# Patient Record
Sex: Male | Born: 1950 | Race: White | Hispanic: No | Marital: Single
Health system: Midwestern US, Academic
[De-identification: ages and names within clinical notes are randomized; demographics above are authoritative.]

---

## 2018-01-28 IMAGING — CT SINUS_(Adult)
3 of 4 series · 11 of 47 positions shown, 13 images · non-contrast
Comparison: none

[Series 2: sinus ax 3.00 hr60 s3 · axial · 0.27mm/px · z∈[-498,-419]mm · 5 of 39 slices shown, 7 images]
[im 6/39  brain]
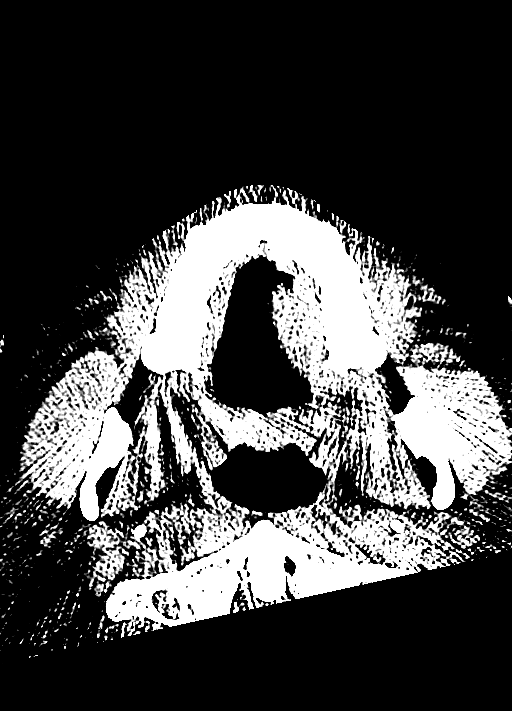
[im 6/39  bone]
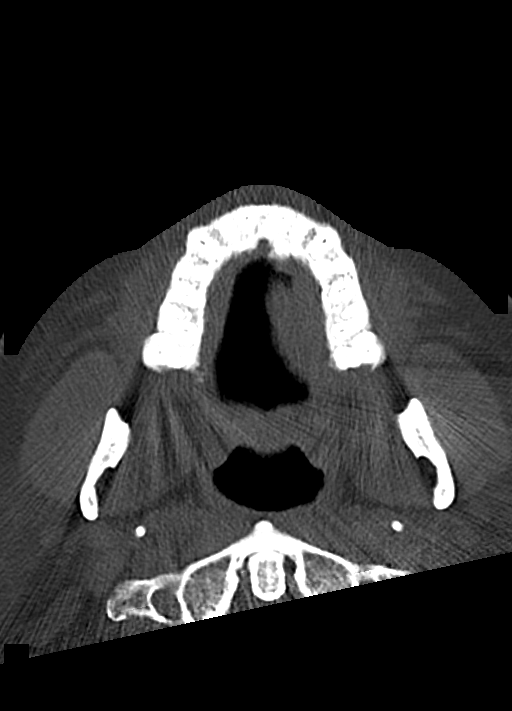
[im 11/39  bone]
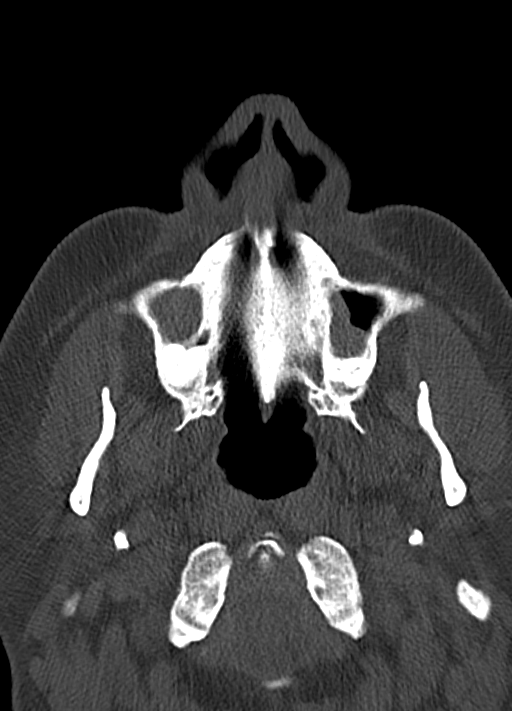
[im 20/39  bone]
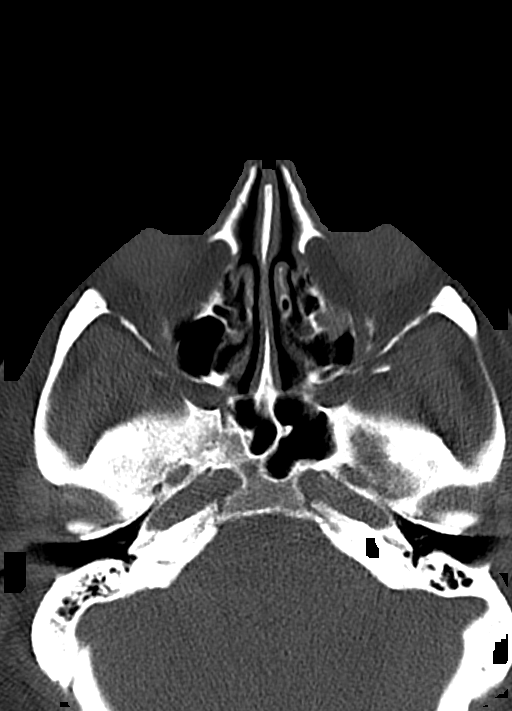
[im 28/39  bone]
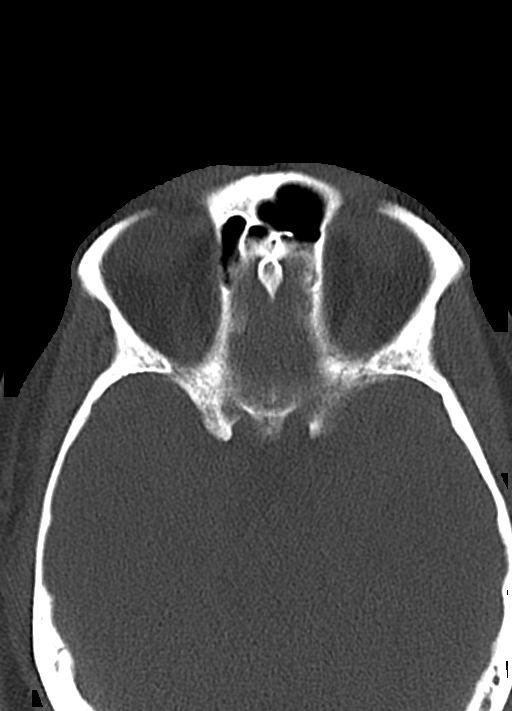
[im 33/39  brain]
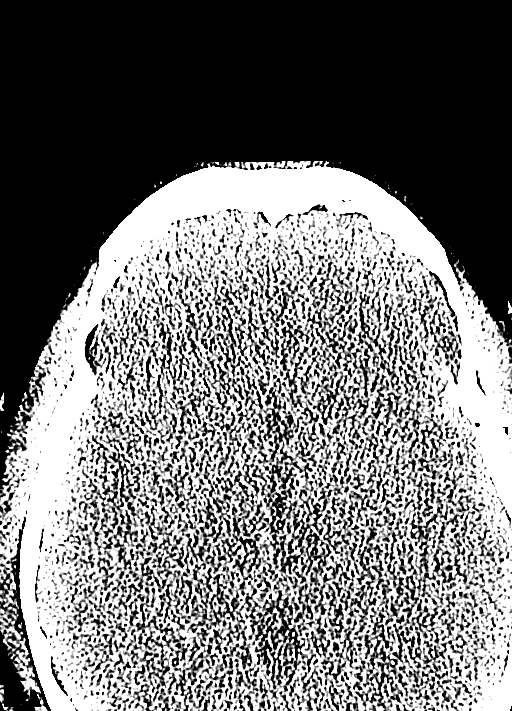
[im 33/39  bone]
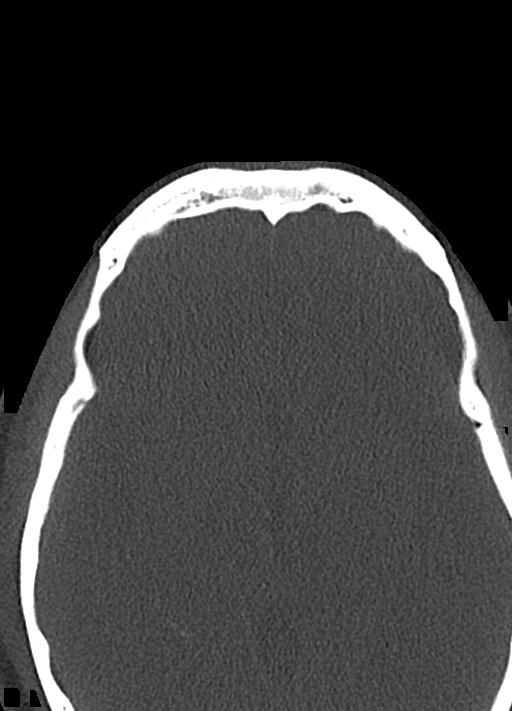

[Series 4: sinus cor 3.00 hr60 s3 · coronal · 0.23mm/px · 3 of 63 slices shown]
[im 21/63  bone]
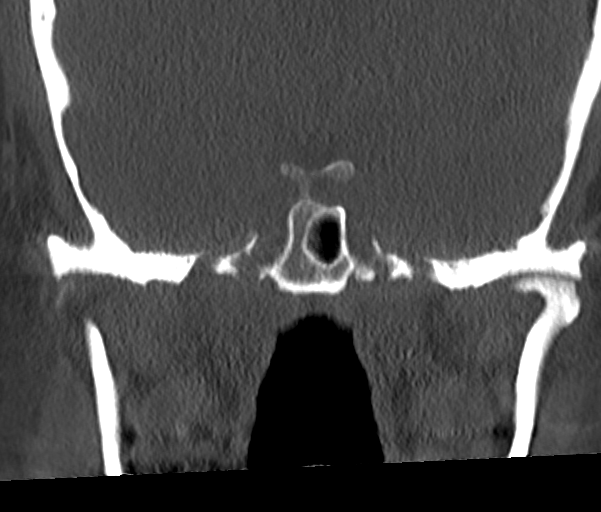
[im 28/63  bone]
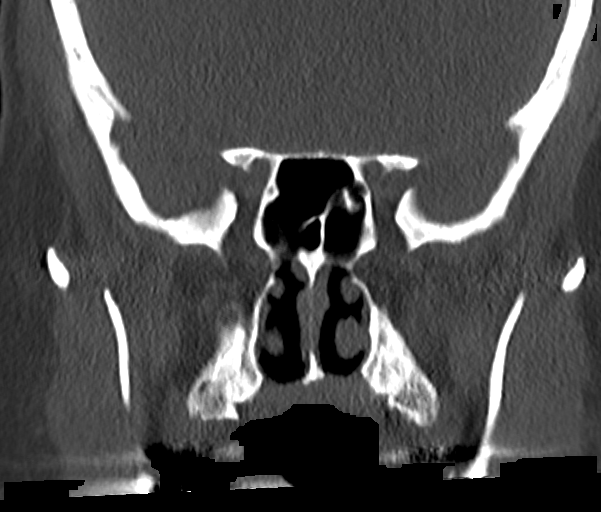
[im 35/63  bone]
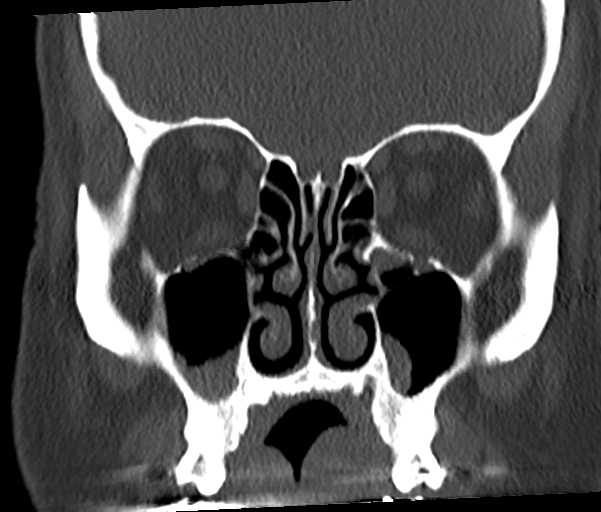

[Series 6: sinus sag 3.00 hr60 s3 · sagittal · 0.23mm/px · 3 of 45 slices shown]
[im 15/45  bone]
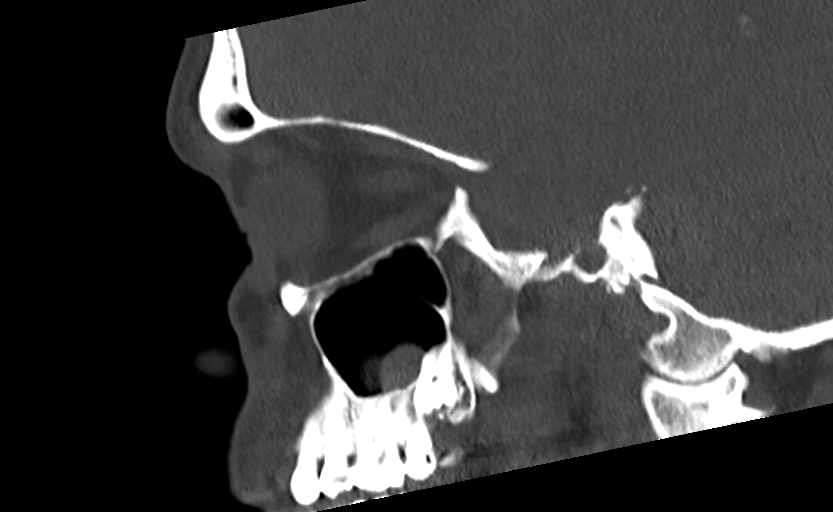
[im 23/45  bone]
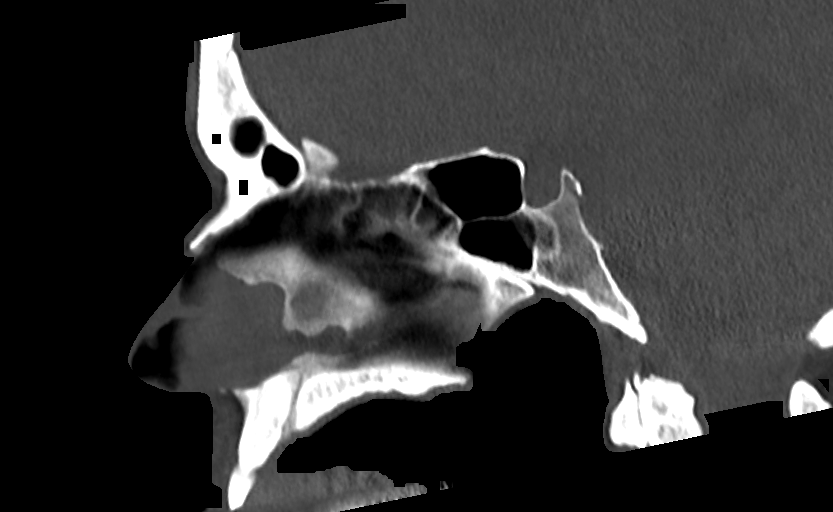
[im 30/45  bone]
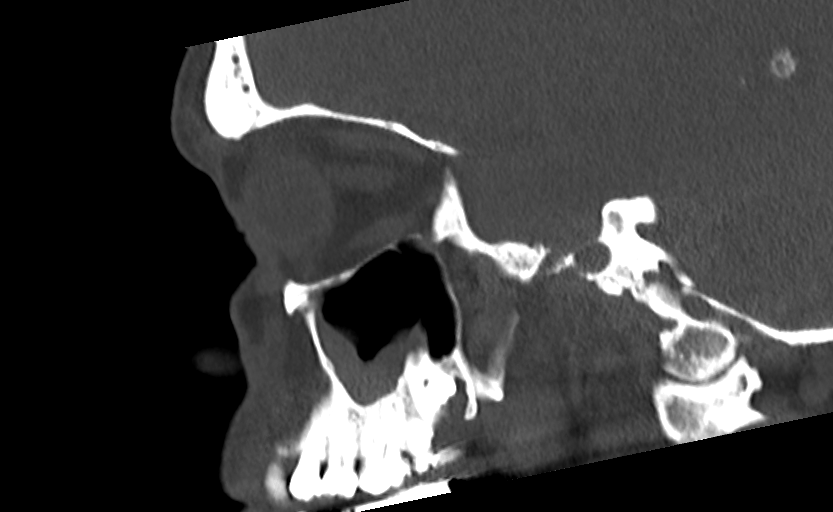

[11 of 47 positions shown; findings below may reference images not displayed]

------------- REPORT [id number] -------------
EXAM

CT paranasal sinuses without contrast.

INDICATION

chronic sinusitis
chronic sinusitis

FINDINGS

CT of the paranasal sinuses was performed without contrast.

All CT scans at this facility use dose modulation, iterative reconstruction, and/or weight based
dosing when appropriate to reduce radiation dose to as low as reasonably achievable.

There of been no CT scans in no nuclear medicine myocardial perfusion scans in the past 12 months.

There is moderately pronounced mucosal thickening involving the maxillary sinuses bilaterally, more
notably on the right.  Small mucous retention cysts are present bilaterally.

There is very mild mucosal thickening in the ethmoid sinuses.  There is hypoplasia of the right
frontal sinus.  The left frontal sinus is clear.  The sphenoid sinus is clear.

There is patency of the ostiomeatal complexes bilaterally.

IMPRESSION

There is evidence of moderate chronic bilateral maxillary sinusitis, slightly more notably on the
right.  There is mild chronic bilateral ethmoid sinusitis.  There is no bone destruction in there
are no air-fluid levels.  There is patency of the ostiomeatal complexes.

Tech Notes:

chronic sinusitis

------------- REPORT [id number] -------------
**ADDENDUM**
ADDENDUM

Number of previous computed tomography exams in the last 12 months is 0.

Number of previous nuclear medicine myocardial perfusion studies in the last 12 months is 0.

TD/TT: /

EXAM

CT paranasal sinuses without contrast.

INDICATION

chronic sinusitis
chronic sinusitis

FINDINGS

CT of the paranasal sinuses was performed without contrast.

All CT scans at this facility use dose modulation, iterative reconstruction, and/or weight based
dosing when appropriate to reduce radiation dose to as low as reasonably achievable.

There of been no CT scans in no nuclear medicine myocardial perfusion scans in the past 12 months.

There is moderately pronounced mucosal thickening involving the maxillary sinuses bilaterally, more
notably on the right.  Small mucous retention cysts are present bilaterally.

There is very mild mucosal thickening in the ethmoid sinuses.  There is hypoplasia of the right
frontal sinus.  The left frontal sinus is clear.  The sphenoid sinus is clear.

There is patency of the ostiomeatal complexes bilaterally.

IMPRESSION

There is evidence of moderate chronic bilateral maxillary sinusitis, slightly more notably on the
right.  There is mild chronic bilateral ethmoid sinusitis.  There is no bone destruction in there
are no air-fluid levels.  There is patency of the ostiomeatal complexes.

Tech Notes:

chronic sinusitis

## 2019-01-05 IMAGING — CR CHEST
3 series · 3 of 3 positions shown · non-contrast
Comparison: none

[shoulder external]
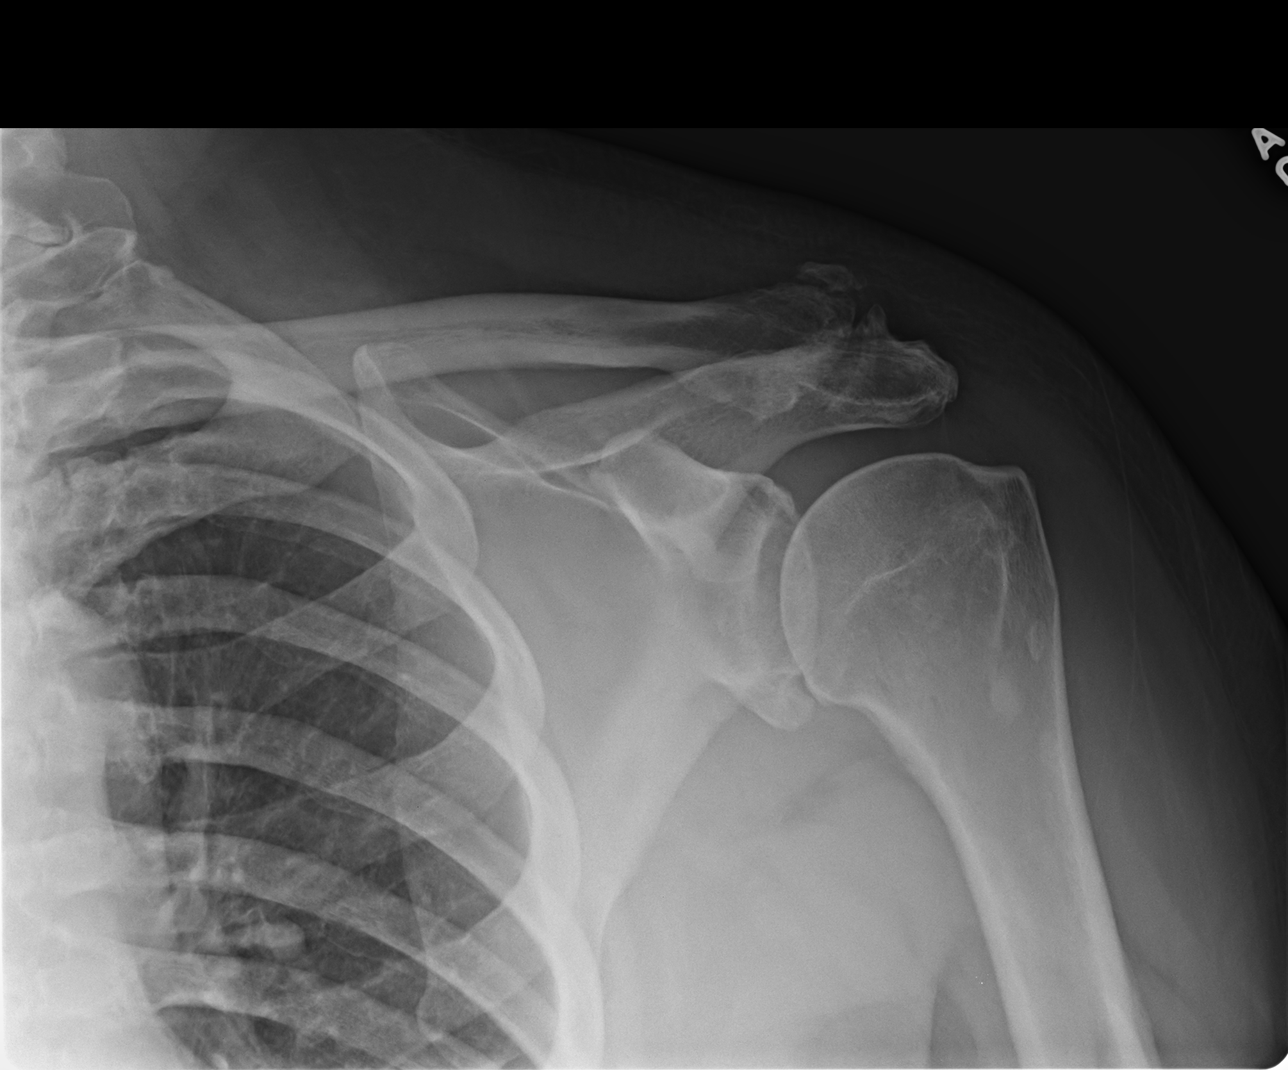

[shoulder internal]
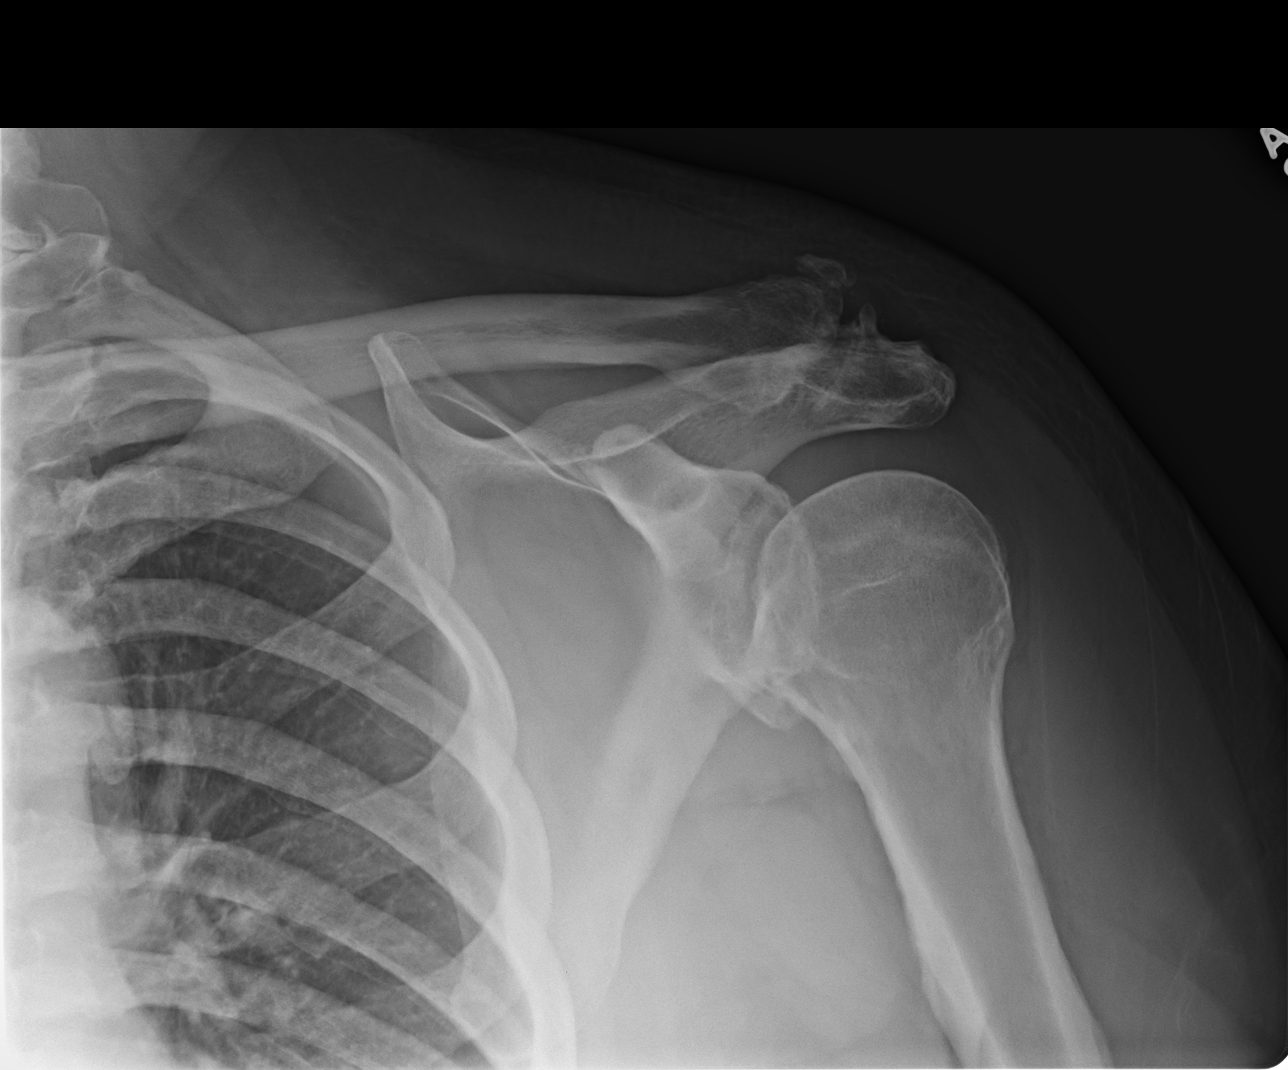

[shoulder y-view]
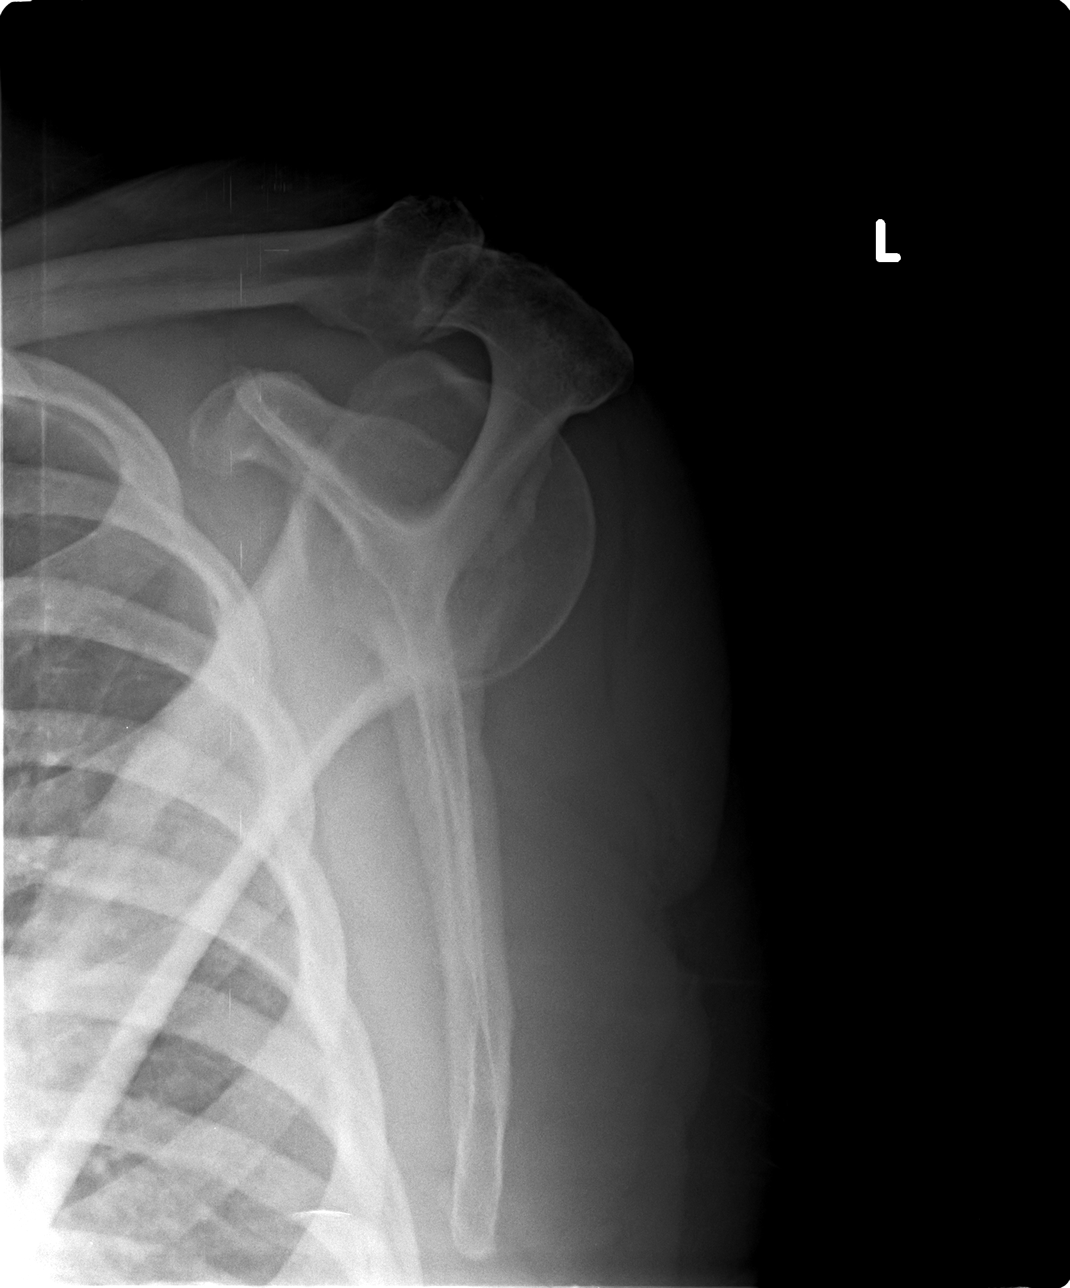

[3 of 3 positions shown; findings below may reference images not displayed]

EXAM

Left shoulder.

INDICATION

pain
Pt states pail in bilateral shoulders in joint area. PT states no known injury. JC/TB

FINDINGS

Three views of the left shoulder were obtained.

There are large osteophytes of the distal clavicle and acromion process.  There is ossification of
the within the soft tissues along the superior margin of the distal clavicle.

There are moderately sized osteophytes of the glenoid process, especially inferiorly.  There are
subchondral cysts of the glenoid process.  There are small osteophytes of the humeral head.

There is no fracture or dislocation.

IMPRESSION

There is pronounced osteoarthrosis of the left AC joint and moderate osteoarthrosis of the
glenohumeral joint.  No fracture or dislocation is identified.

Tech Notes:

Pt states pail in bilateral shoulders in joint area. PT states no known injury. JC/TB

## 2019-01-05 IMAGING — CR CHEST
3 series · 3 of 3 positions shown · non-contrast
Comparison: none

[shoulder external]
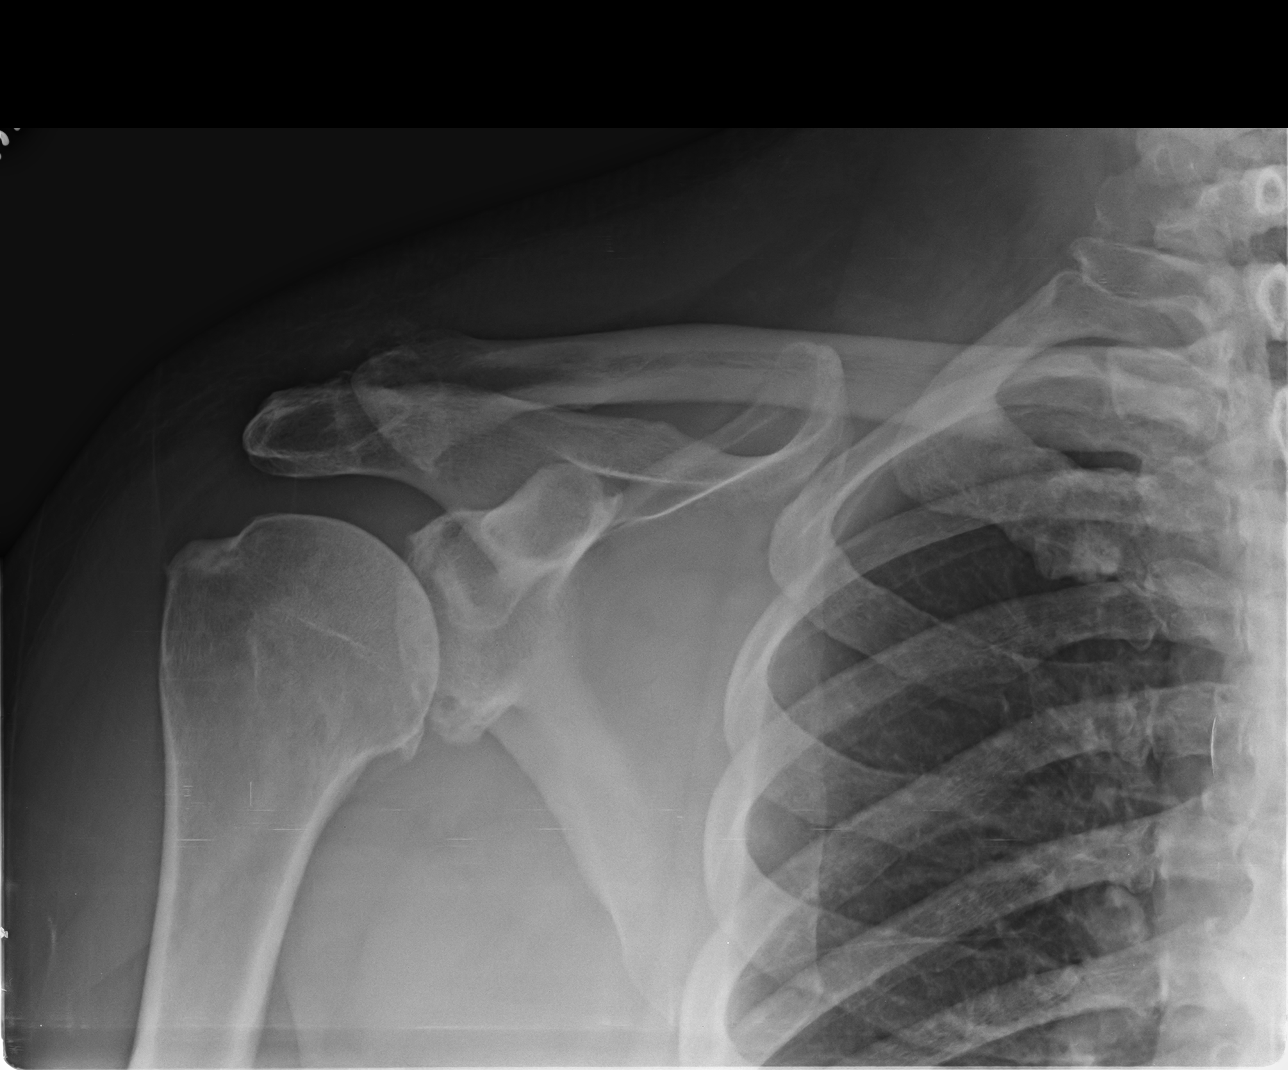

[shoulder internal]
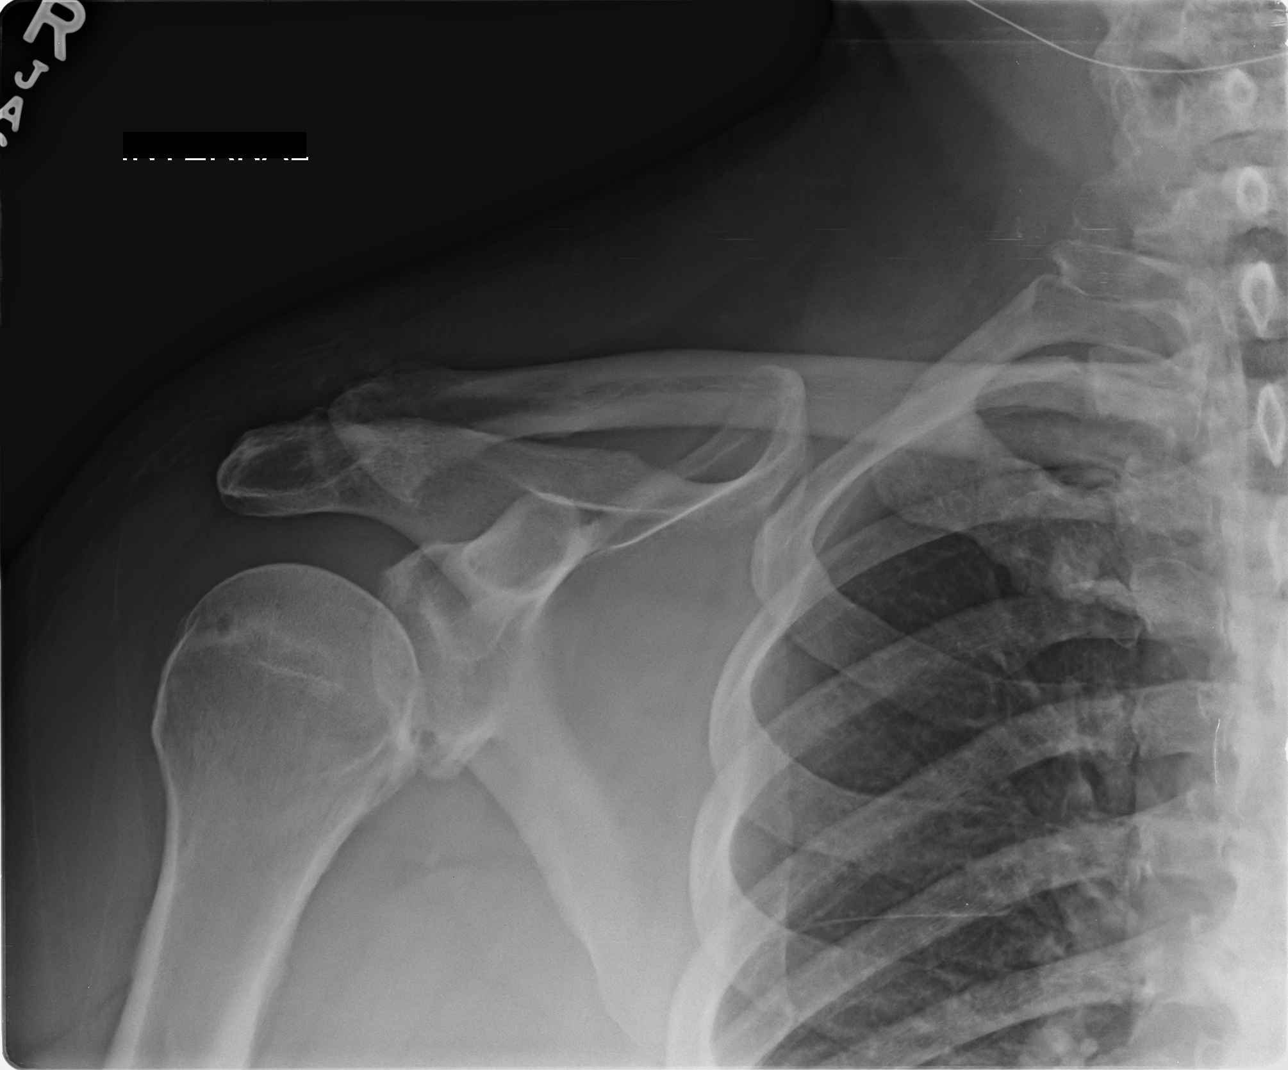

[shoulder y-view]
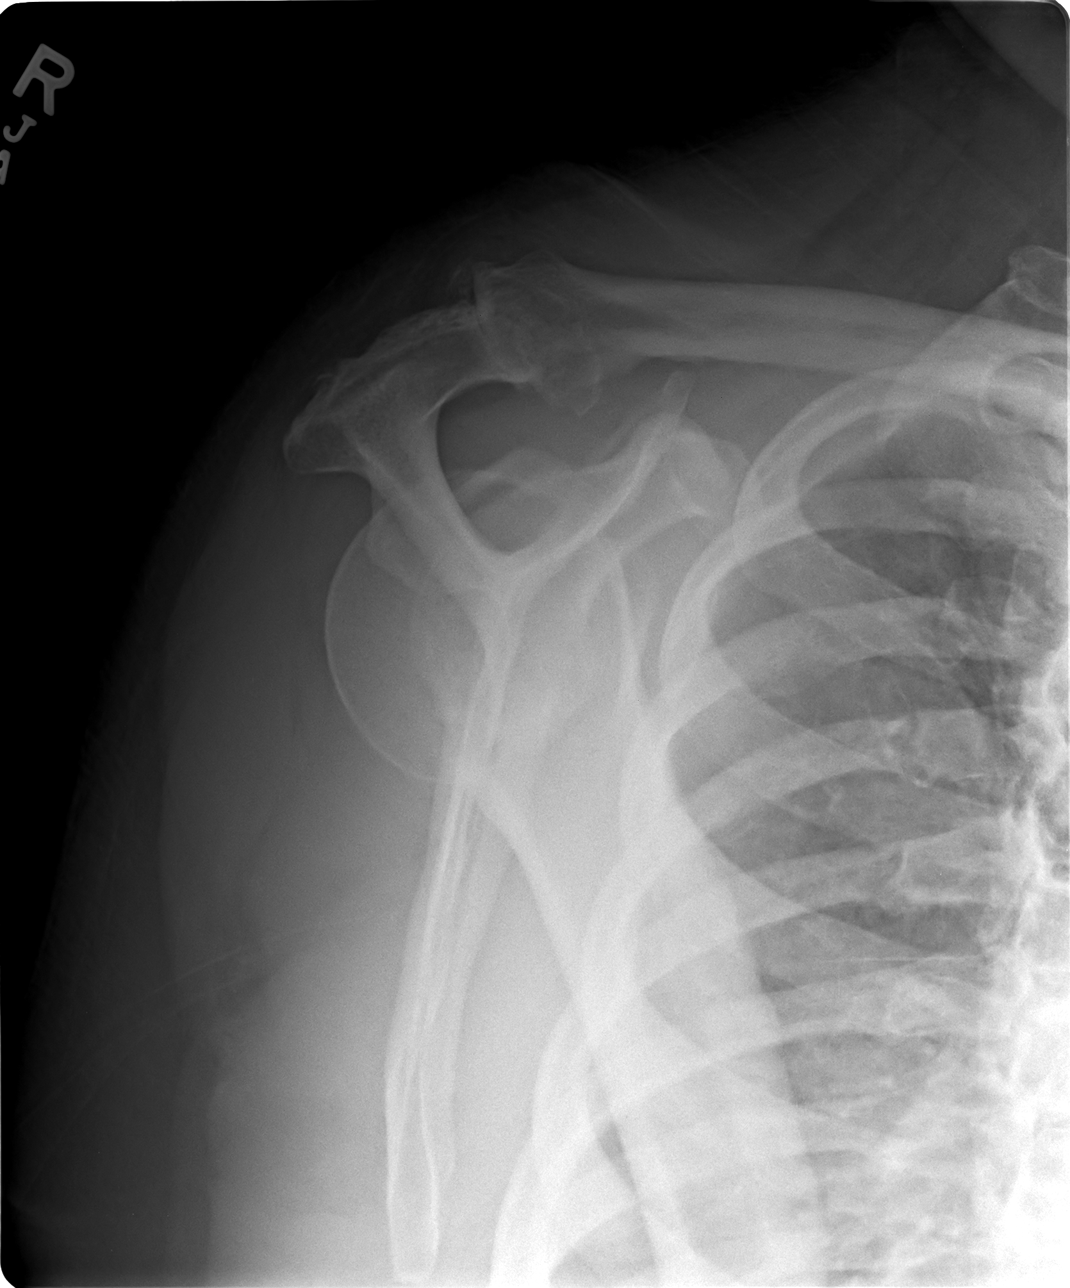

[3 of 3 positions shown; findings below may reference images not displayed]

EXAM

Right shoulder.

INDICATION

pain
Pt states pain in bilateral shoulders in joint area. PT states no known injury. JC/TB

FINDINGS

Three views of the right shoulder were obtained.

There are moderately sized osteophytes of the distal clavicle and acromion process.  There are
moderately sized osteophytes of the glenoid process and humeral head.  There are subchondral cysts
of the glenoid process.

There is no fracture or dislocation.

IMPRESSION

There is moderately pronounced osteoarthrosis of the right glenohumeral and AC joints.  No acute
appearing abnormality is identified.

Tech Notes:

Pt states pain in bilateral shoulders in joint area. PT states no known injury. JC/TB

## 2019-01-06 IMAGING — CR CHEST
3 series · 3 of 3 positions shown · non-contrast
Comparison: none

[shoulder grashey]
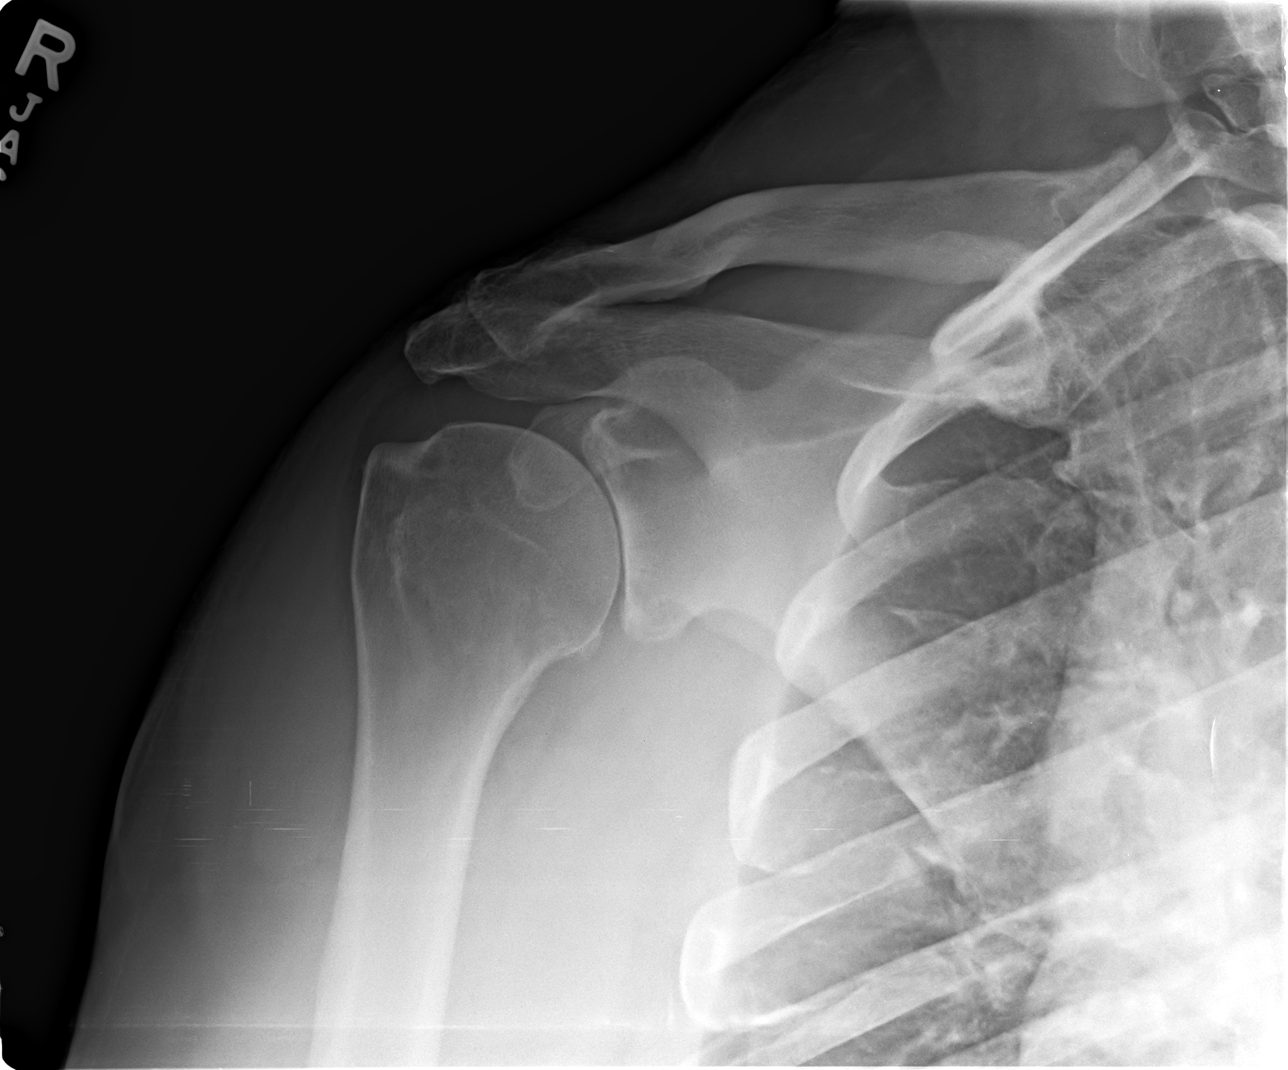

[shoulder y-view]
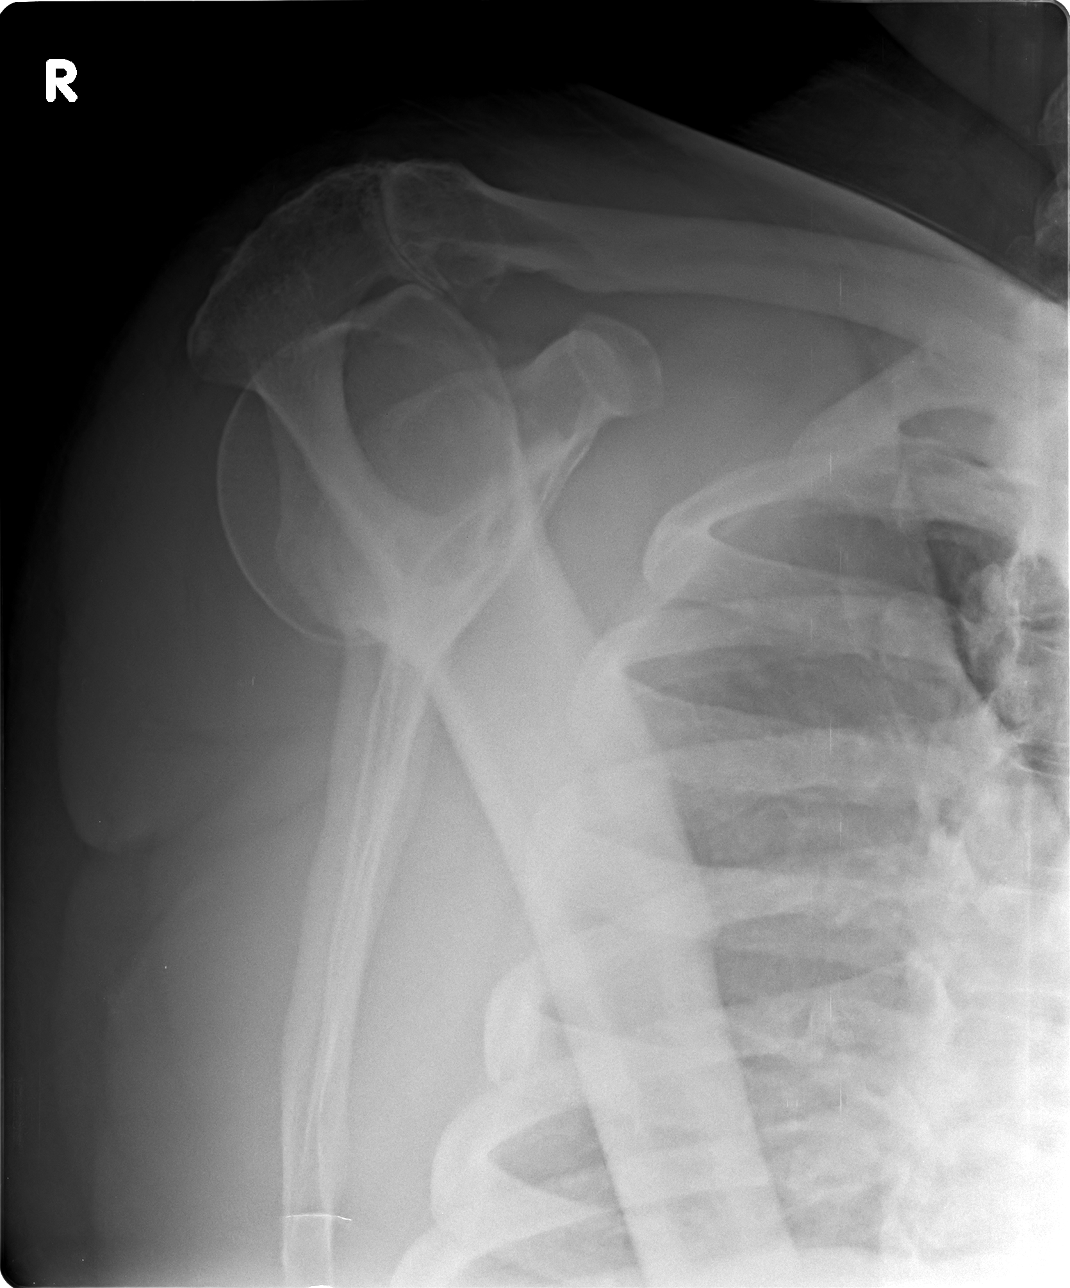

[shoulder axillary]
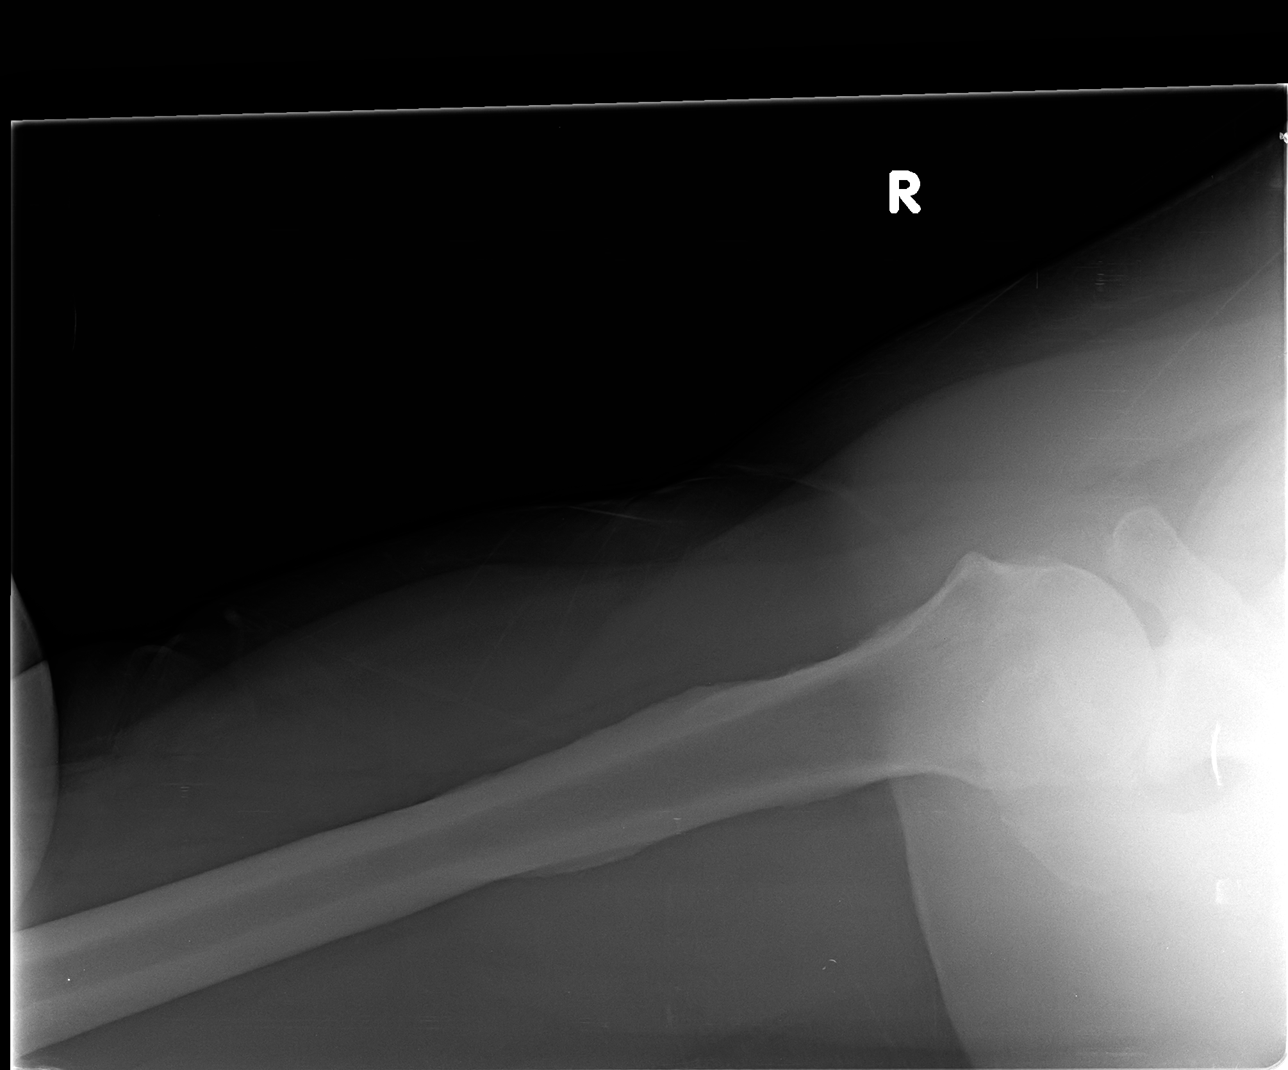

[3 of 3 positions shown; findings below may reference images not displayed]

EXAM

RADIOLOGICAL EXAMINATION SHOULDER; COMPLETE, MINIMUM 2 VIEWS CPT 69494

INDICATION

LEFT shoulder pain. CHRONIC BILAT SHOULDER PAIN W/DECREASED ROM. JC/HB

TECHNIQUE

Three views of the left shoulder were acquired.

COMPARISONS

Reference is made to standard three-view left shoulder performed on 01/05/2019.

FINDINGS

Grashey day, axillary and Y-views views of the left shoulder show no acute fracture or dislocation.
AC joint appears aligned with moderately severe osteophytic spurring and subacromial spurring.

Moderate degenerative change of the glenohumeral joint is present as well. There are no blastic or
lytic lesions. Adjacent ribs appear normal. Soft tissues appear normal.

IMPRESSION

Osteoarthrosis of a moderately severe degree is identified in the left shoulder joint.

EXAM

RADIOLOGICAL EXAMINATION SHOULDER; COMPLETE, MINIMUM 2 VIEWS CPT 69494

INDICATION

RIGHT shoulder pain. CHRONIC BILAT SHOULDER PAIN W/DECREASED ROM. JC/HB

TECHNIQUE

Three views of the right shoulder were acquired.

COMPARISONS

Reference is made to standard three-view right shoulder dated 01/05/2019.

FINDINGS

Grashey, scapular Y and axillary views of the right shoulder show no acute fracture or dislocation.
AC joint appears aligned with moderately severe degenerative change and subacromial spurring. There
are no blastic or lytic lesions. Adjacent ribs appear normal. Soft tissues appear normal.

IMPRESSION

Moderately severe osteoarthrosis of the acromioclavicular and glenohumeral joints.

Tech Notes:

CHRONIC BILAT SHOULDER PAIN W/DECREASED ROM. JC/HB

## 2019-01-06 IMAGING — CR CHEST
3 series · 3 of 3 positions shown · non-contrast
Comparison: none

[shoulder grashey]
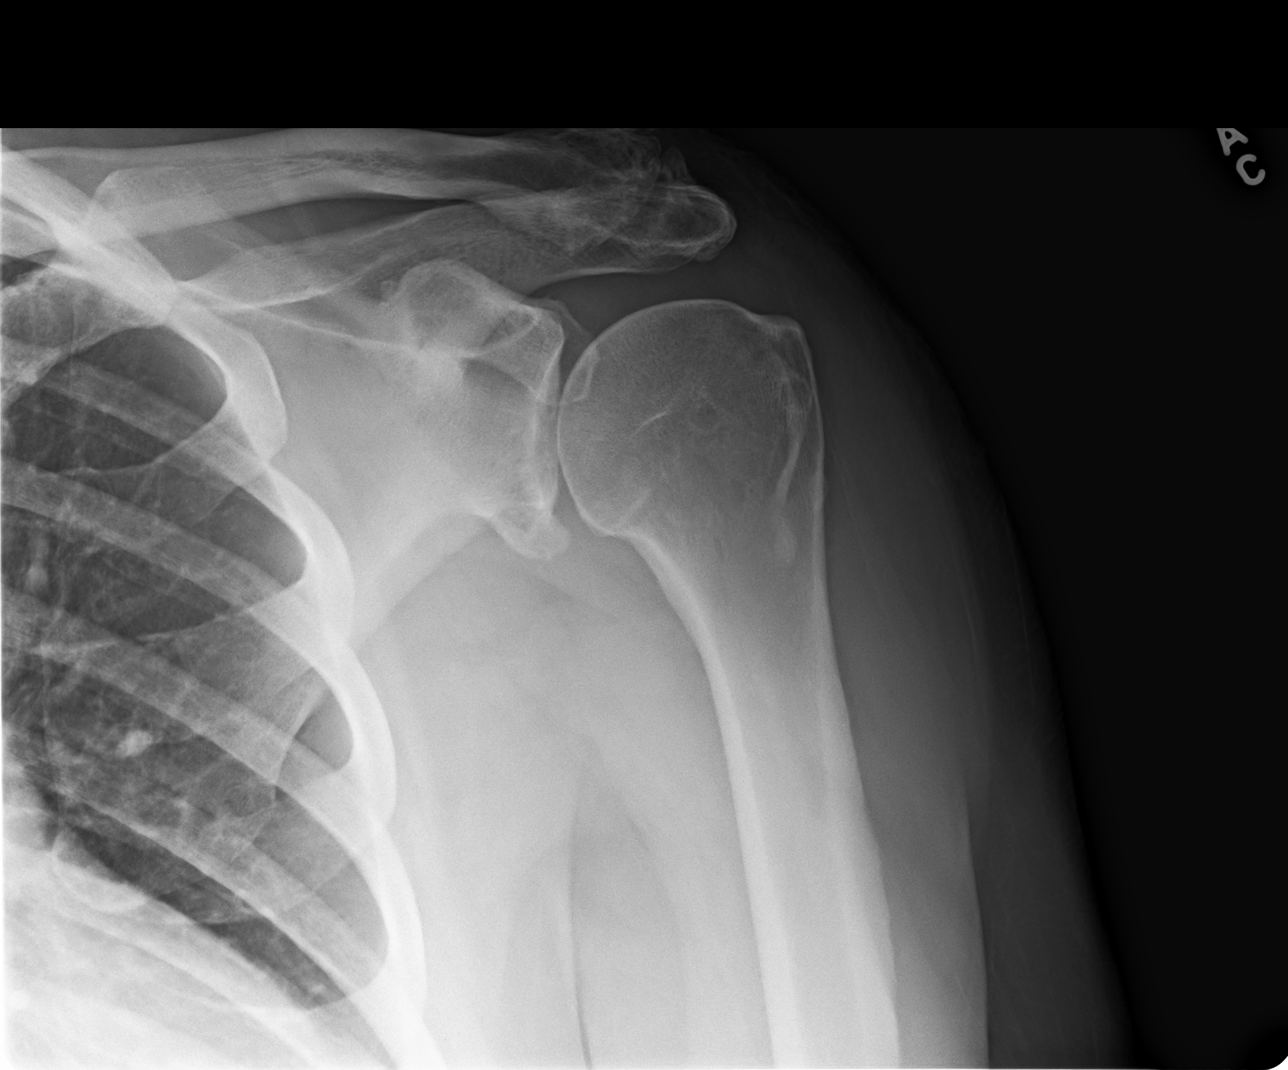

[shoulder y-view]
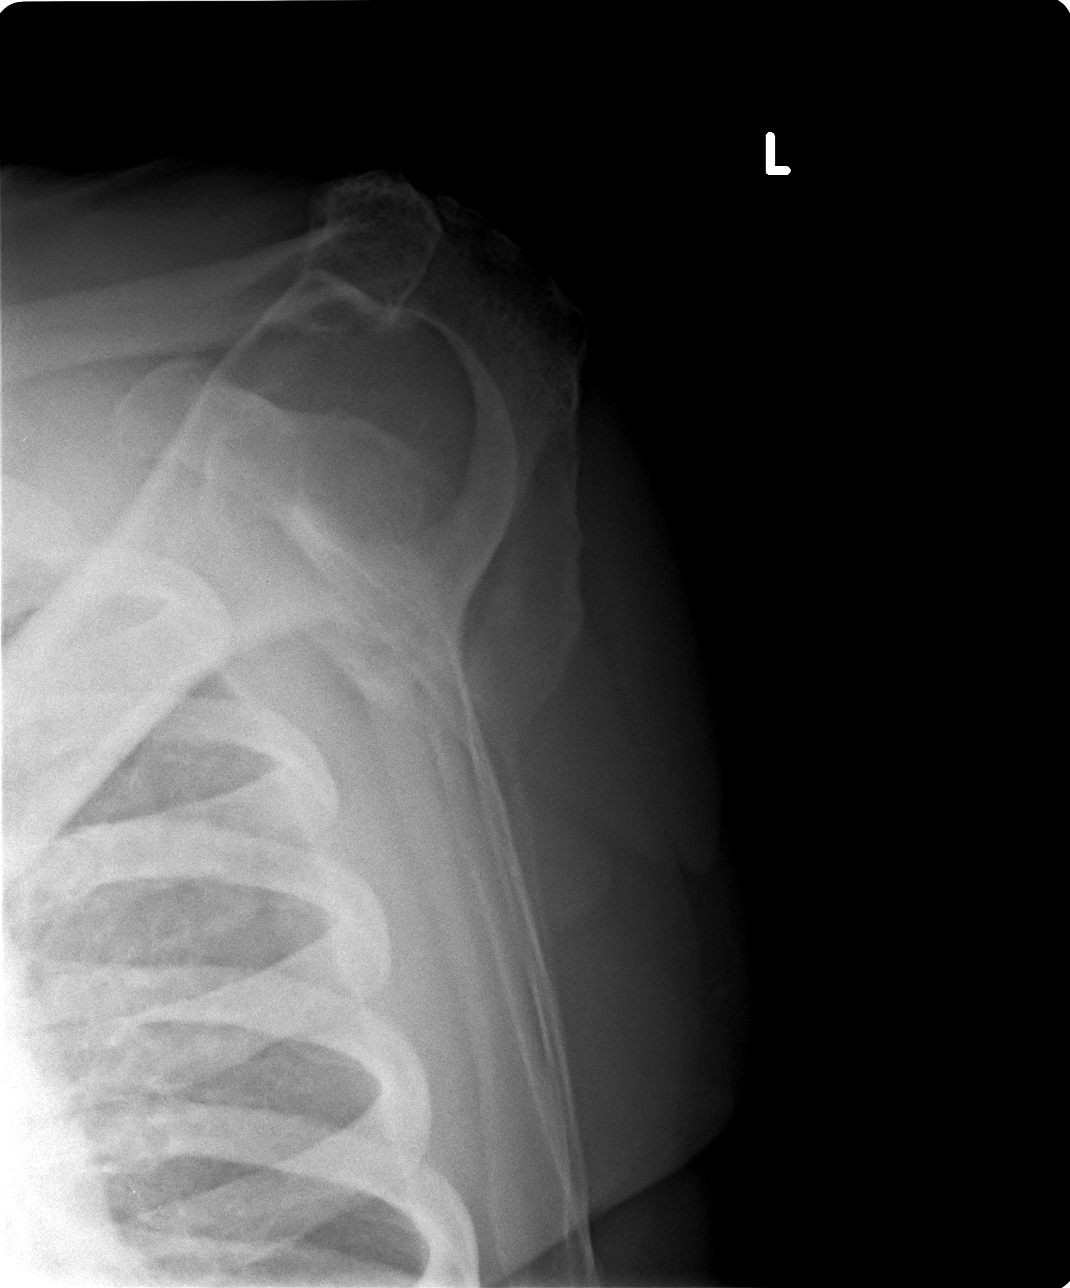

[shoulder axillary]
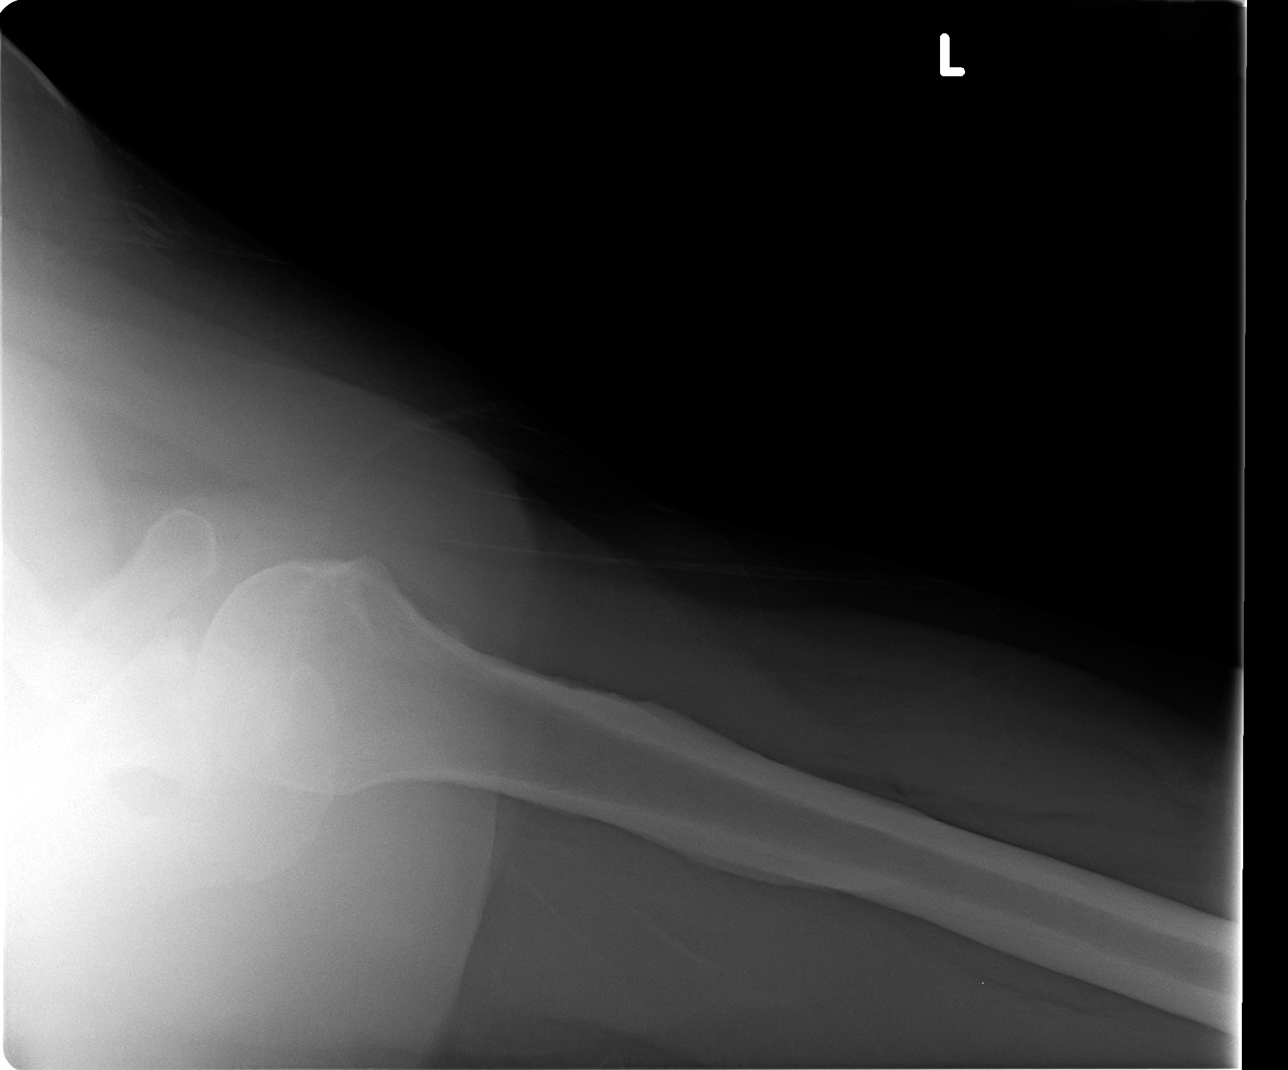

[3 of 3 positions shown; findings below may reference images not displayed]

EXAM

RADIOLOGICAL EXAMINATION SHOULDER; COMPLETE, MINIMUM 2 VIEWS CPT 81414

INDICATION

LEFT shoulder pain. CHRONIC BILAT SHOULDER PAIN W/DECREASED ROM. JC/HB

TECHNIQUE

Three views of the left shoulder were acquired.

COMPARISONS

Reference is made to standard three-view left shoulder performed on 01/05/2019.

FINDINGS

Grashey day, axillary and Y-views views of the left shoulder show no acute fracture or dislocation.
AC joint appears aligned with moderately severe osteophytic spurring and subacromial spurring.

Moderate degenerative change of the glenohumeral joint is present as well. There are no blastic or
lytic lesions. Adjacent ribs appear normal. Soft tissues appear normal.

IMPRESSION

Osteoarthrosis of a moderately severe degree is identified in the left shoulder joint.

EXAM

RADIOLOGICAL EXAMINATION SHOULDER; COMPLETE, MINIMUM 2 VIEWS CPT 81414

INDICATION

RIGHT shoulder pain. CHRONIC BILAT SHOULDER PAIN W/DECREASED ROM. JC/HB

TECHNIQUE

Three views of the  right shoulder were acquired.

COMPARISONS

Reference is made to standard three-view right shoulder dated 01/05/2019.

FINDINGS

Grashey, scapular Y and axillary views of the  right shoulder show no acute fracture or
dislocation. AC joint appears aligned with moderately severe degenerative change and subacromial
spurring. There are no blastic or lytic lesions. Adjacent ribs appear normal. Soft tissues appear
normal.

IMPRESSION

Moderately severe osteoarthrosis of the acromioclavicular and glenohumeral joints.

Tech Notes:

CHRONIC BILAT SHOULDER PAIN W/DECREASED ROM. JC/HB

## 2020-04-25 IMAGING — CR SHOULDCMRT
3 series · 3 of 3 positions shown · non-contrast
Comparison: none

[shoulder external]
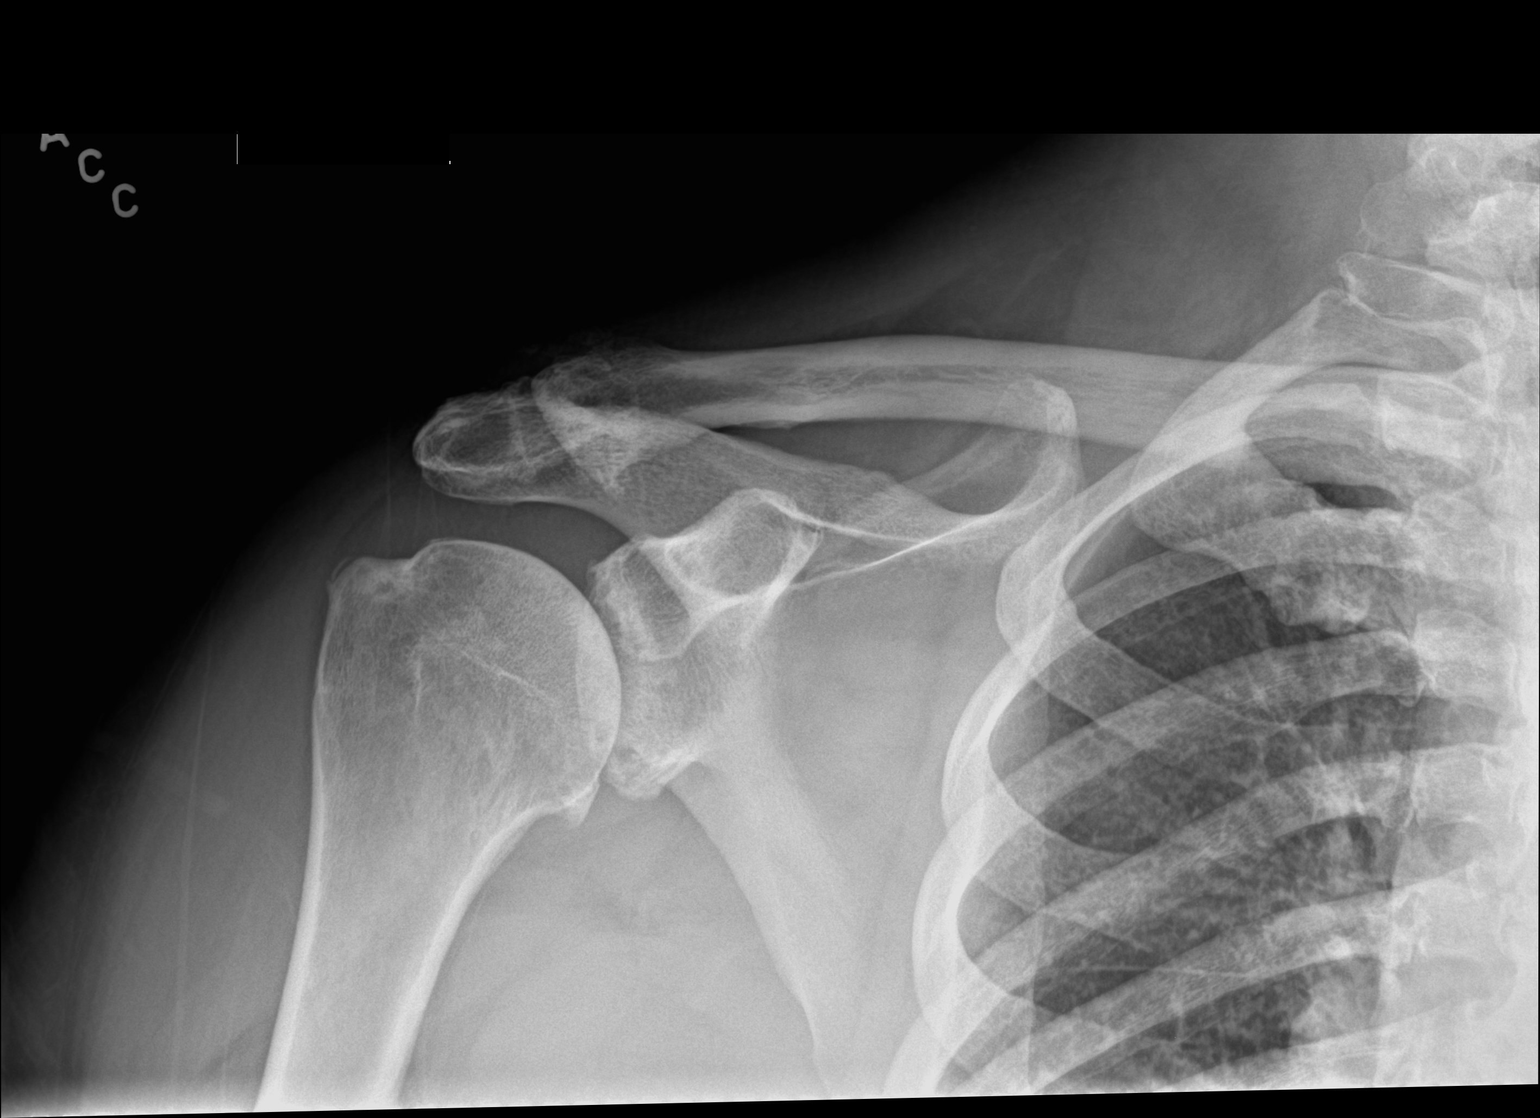

[shoulder internal]
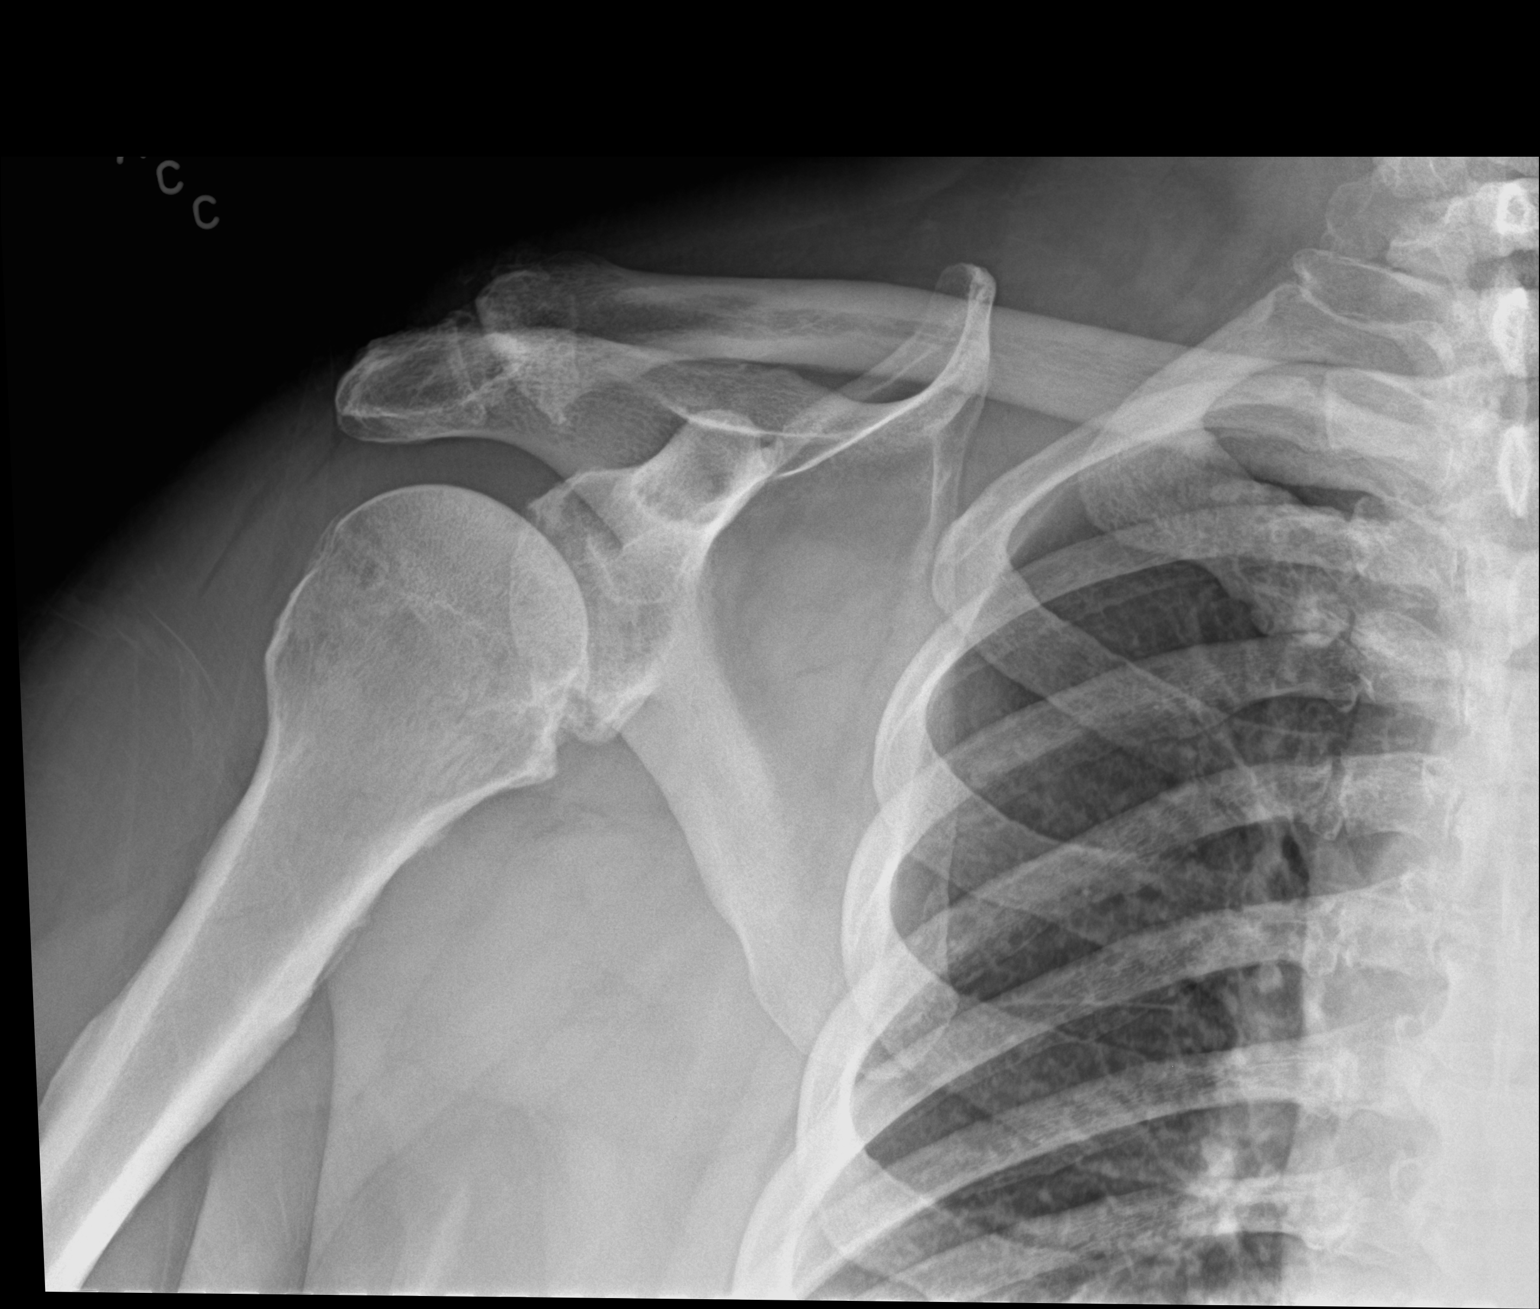

[shoulder axillary]
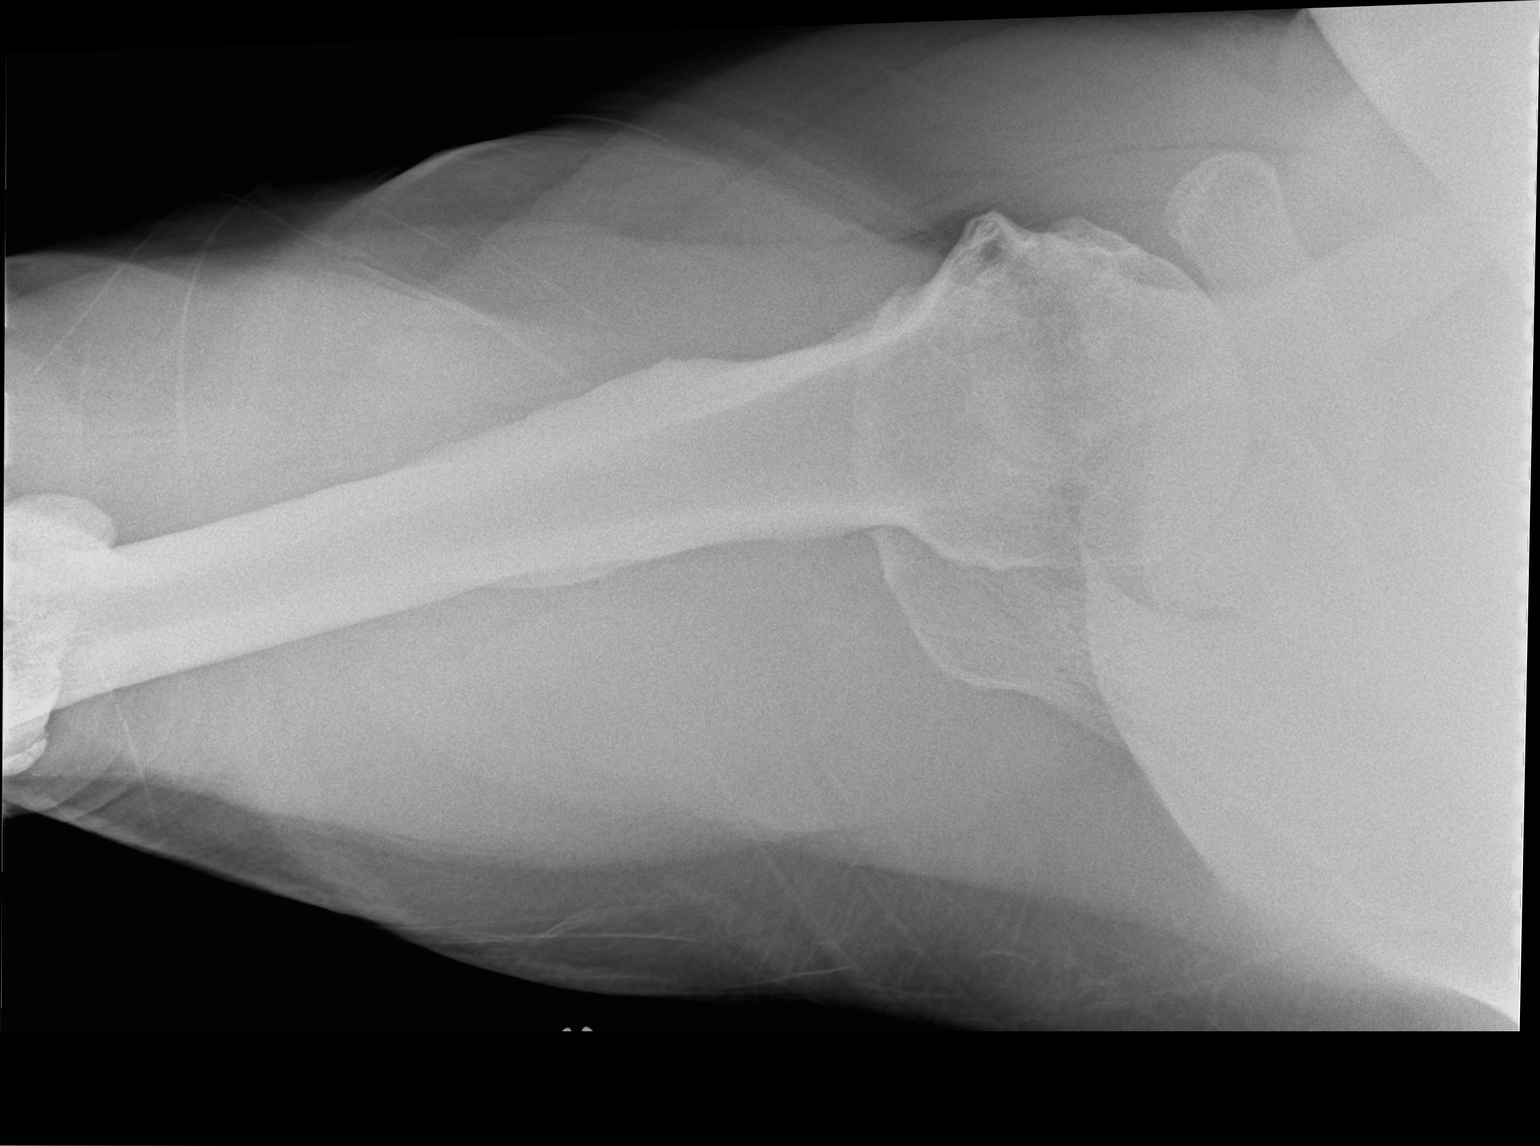

[3 of 3 positions shown; findings below may reference images not displayed]

DIAGNOSTIC STUDIES

EXAM

XR shoulder right, complete

INDICATION

pain
Pt c/o right shoulder pain x years. CF/AC

COMPARISONS

01/06/2019

FINDINGS

No acute fracture or dislocation. Similar appearance of narrowing of the glenohumeral joint space
with marginal osteophyte formation. Similar narrowing of the acromioclavicular joint with osteophyte
formation. Overlying soft tissues are within normal limits.

IMPRESSION

Moderate glenohumeral joint and severe acromioclavicular joint osteoarthritis.

Tech Notes:

Pt c/o right shoulder pain x years. CF/AC

## 2020-04-25 IMAGING — CR SHOULDCMLT
2 series · 2 of 2 positions shown · non-contrast
Comparison: none

[shoulder external]
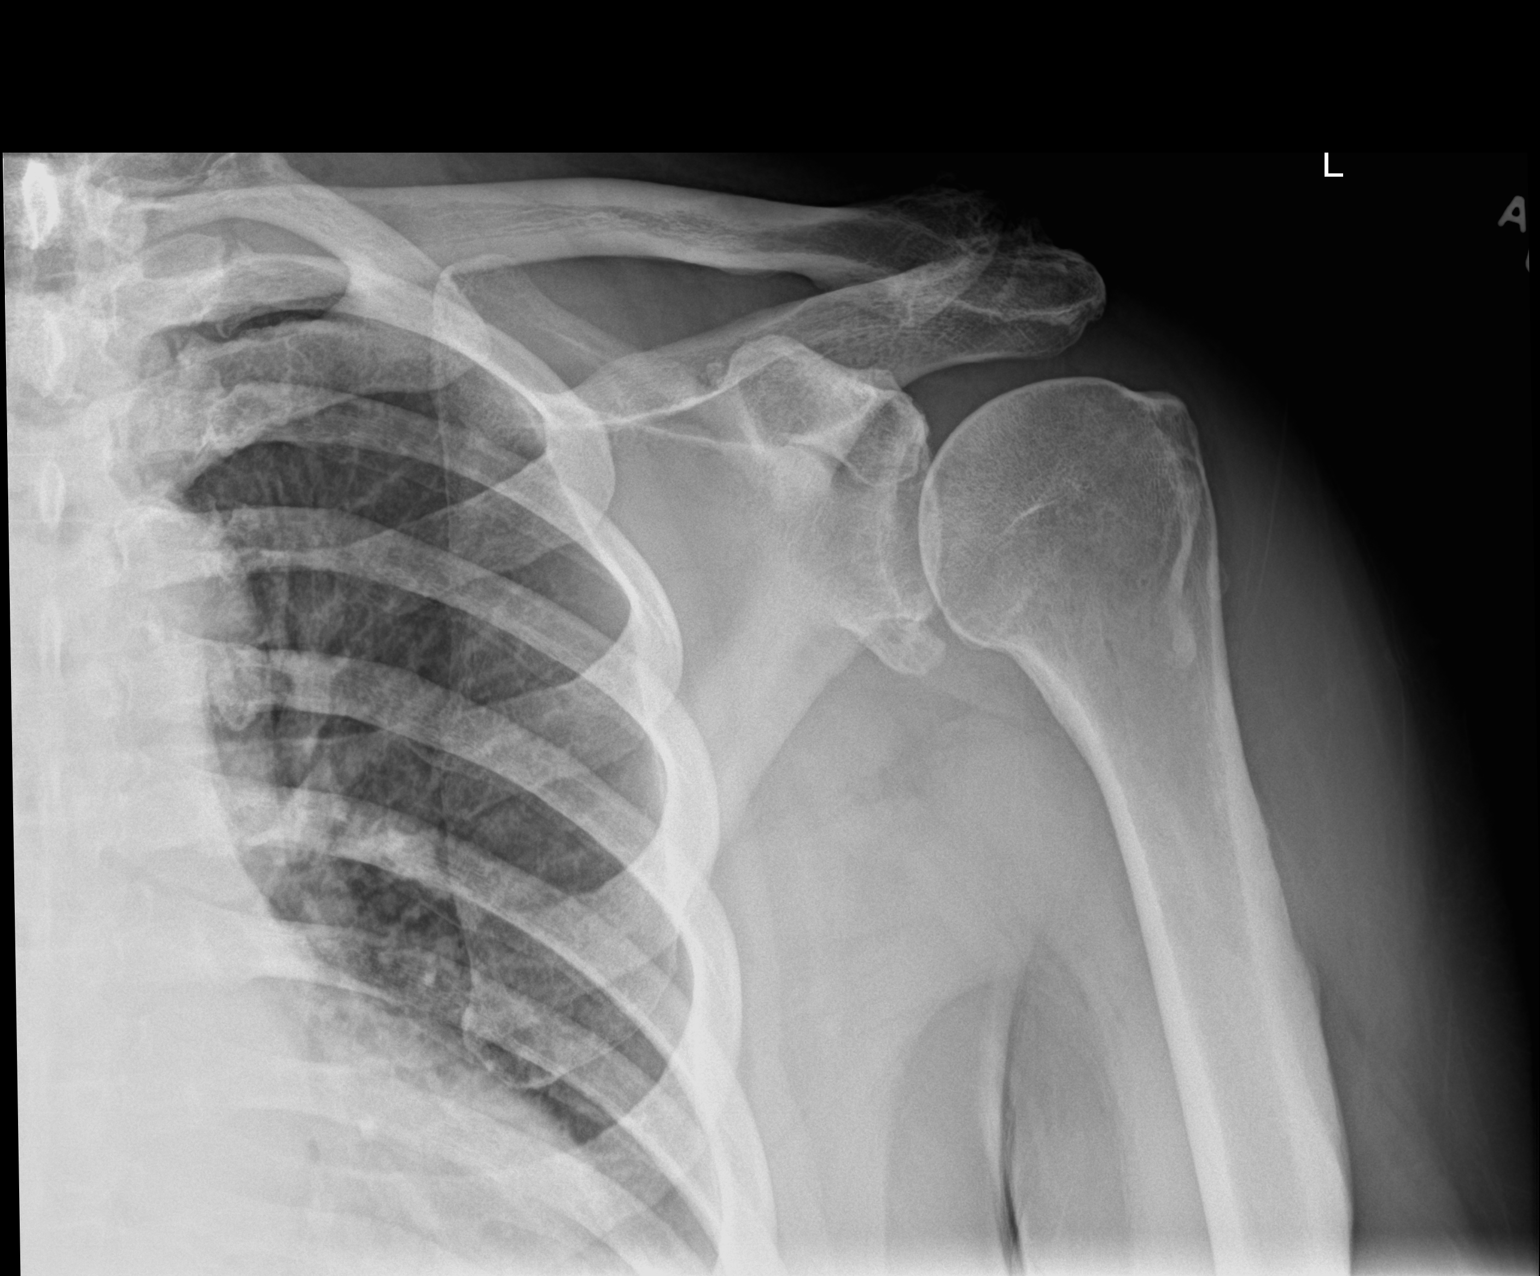

[shoulder y-view]
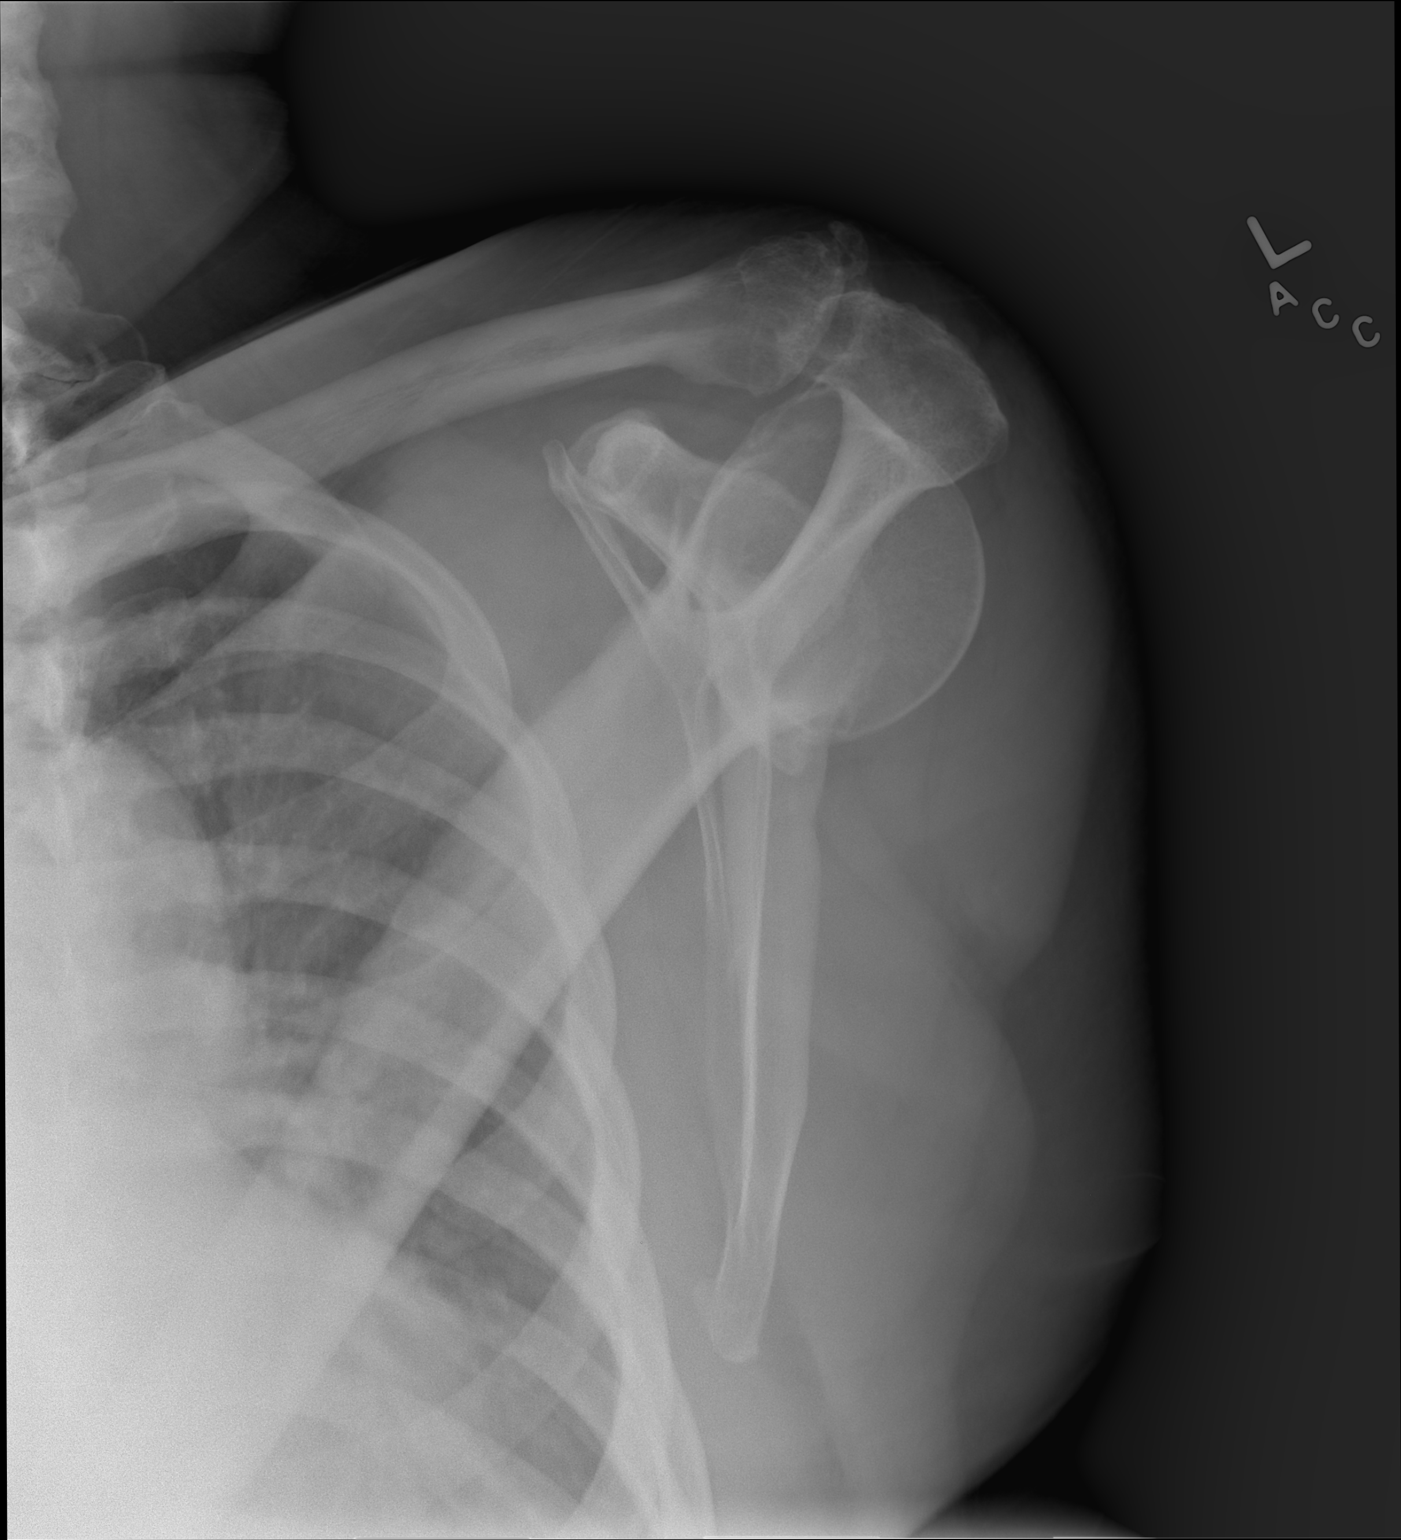

[2 of 2 positions shown; findings below may reference images not displayed]

DIAGNOSTIC STUDIES

EXAM

XR shoulder left, complete

INDICATION

left shoulder
Pt c/o left shoulder pain x years. CF/AC

COMPARISONS

01/06/2019

FINDINGS

No acute fracture or dislocation. There is narrowing of the glenohumeral joint with glenoid
osteophyte formation seen similar to the prior exam. Narrowing of the AC joint with similar
osteophyte formation. Overlying soft tissues are within normal limits.

IMPRESSION

No acute osseous findings. Similar moderate glenohumeral and acromioclavicular joint
osteoarthritis.

Tech Notes:

Pt c/o left shoulder pain x years. CF/AC

## 2020-07-25 IMAGING — CR [ID]
2 series · 2 of 2 positions shown · non-contrast
Comparison: none

[chest pa]
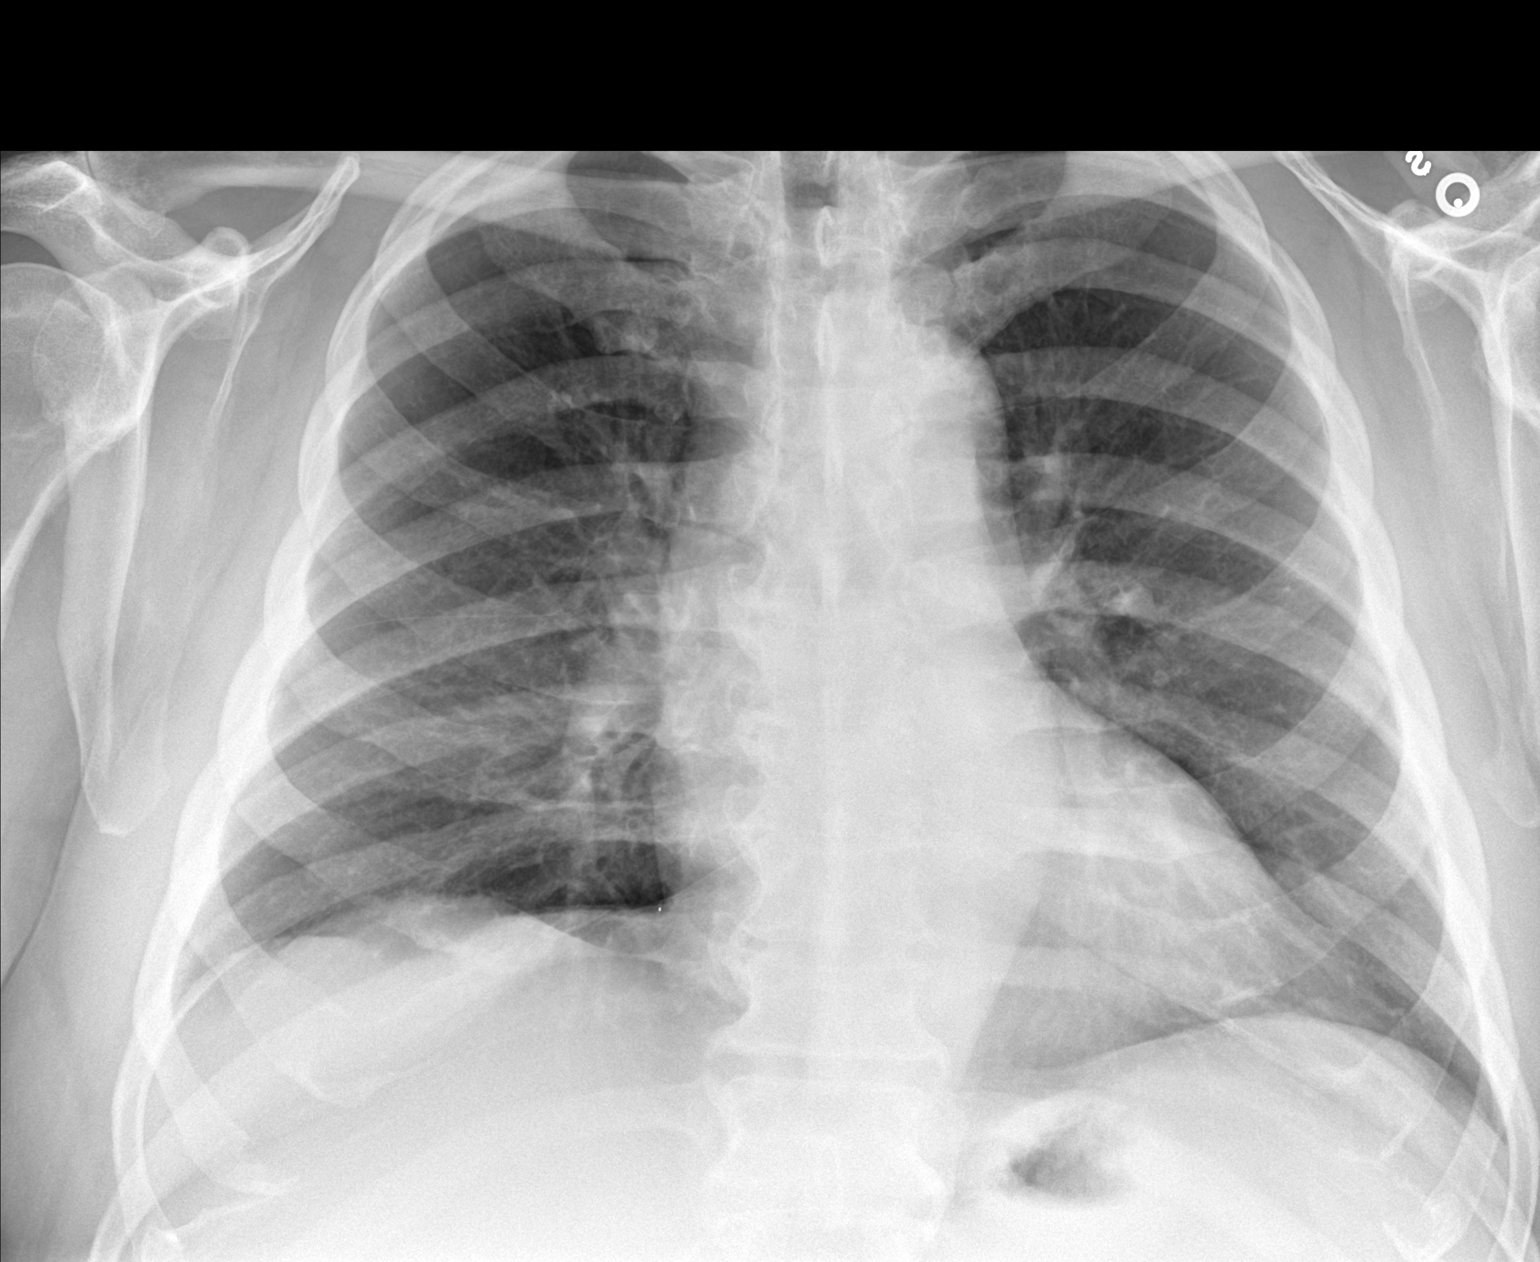

[chest lat]
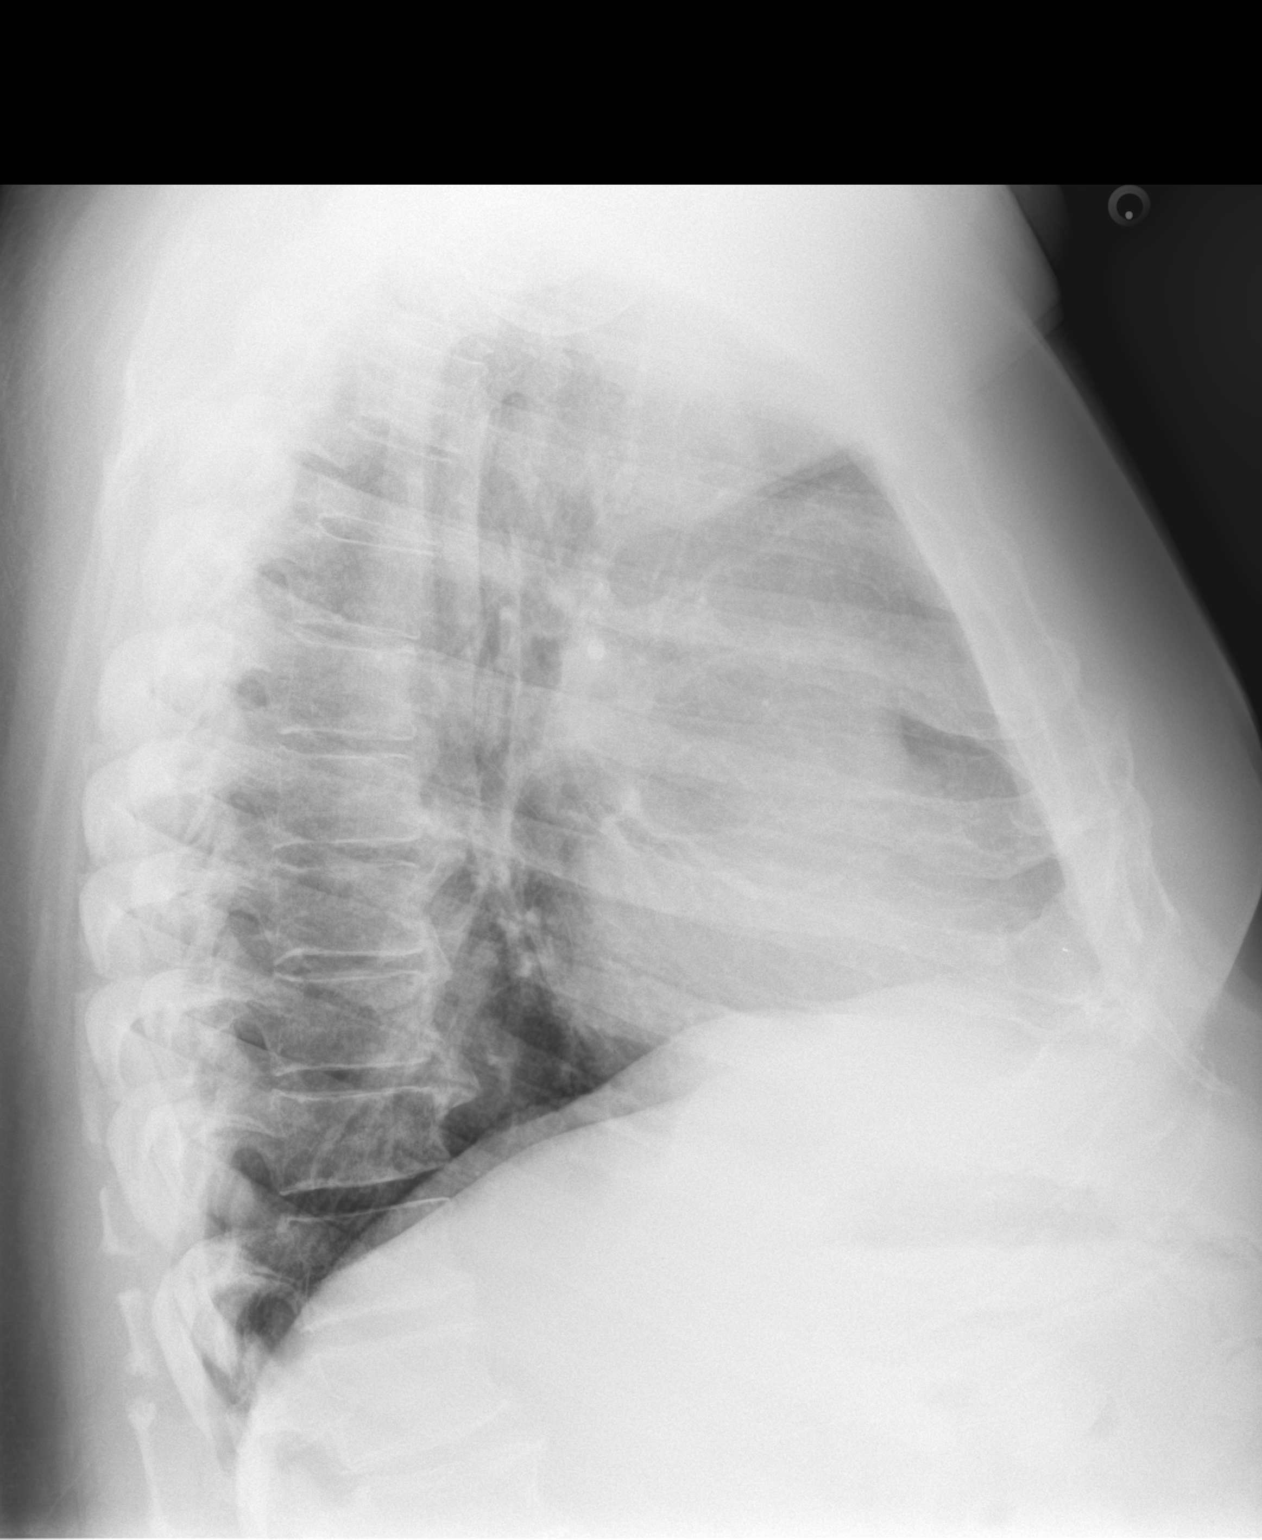

[2 of 2 positions shown; findings below may reference images not displayed]

DIAGNOSTIC STUDIES

EXAM

XR chest 2V

INDICATION

pre-op
Pre-op. CS

TECHNIQUE

PA and lateral views of the chest.

COMPARISONS

None available

FINDINGS

Cardiac silhouette is within normal limits. Lungs are clear. Osseous structures demonstrate changes
of DISH in the dorsal spine.

IMPRESSION

No evidence for active disease in the chest.

Tech Notes:

Pre-op. CS

## 2020-10-03 IMAGING — CR SHOULDCMRT
2 series · 2 of 2 positions shown · non-contrast
Comparison: none

[shoulder external]
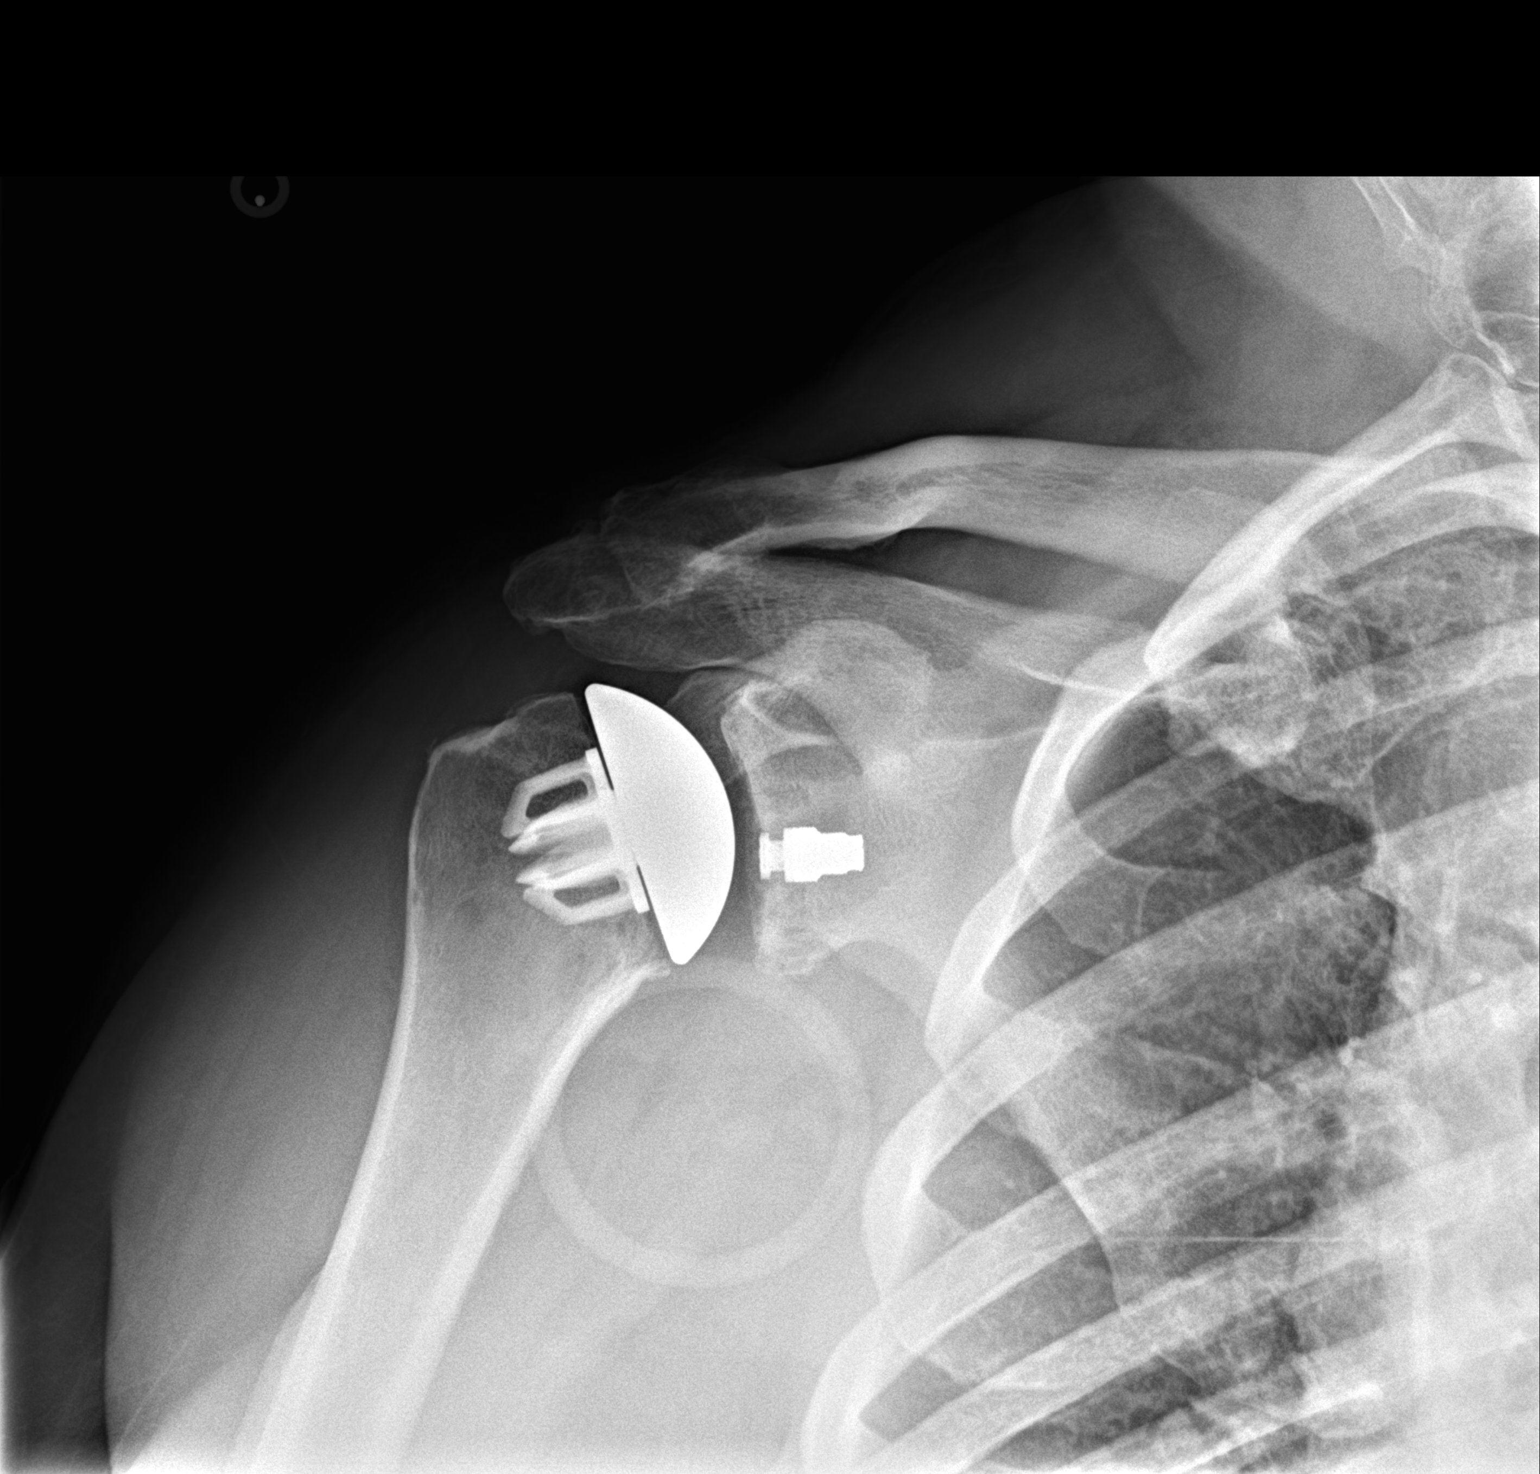

[shoulder y-view]
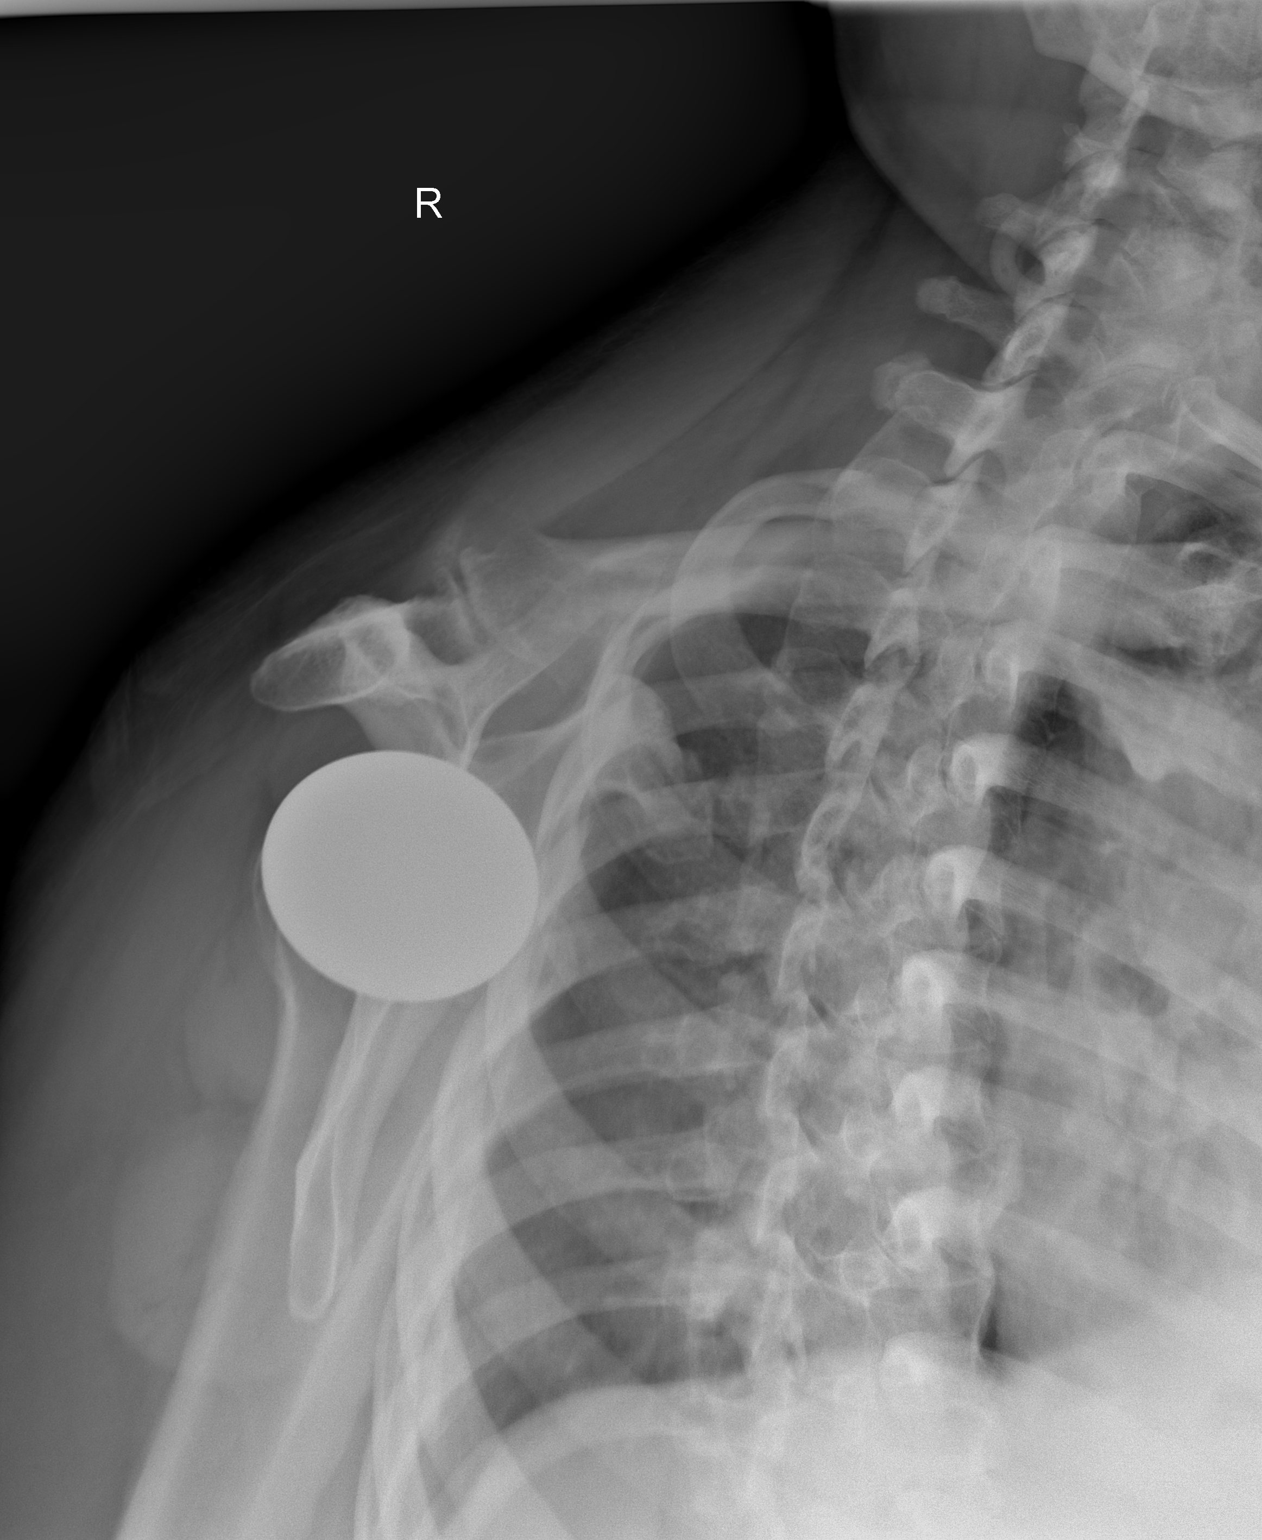

[2 of 2 positions shown; findings below may reference images not displayed]

DIAGNOSTIC STUDIES

EXAM

XR shoulder right, complete

INDICATION

S/P RTSA right shoulder
Right Shoulder S/P RTSA. Pt states the pain is better some days than other.CS

TECHNIQUE

AP and lateral views

COMPARISONS

August 29, 2020

FINDINGS

Right shoulder arthroplasty demonstrates stable alignment and appearance since prior exam. No
fractures are seen. Degenerative changes of the AC joint are noted.

IMPRESSION

Stable appearance of the right shoulder when compared to prior exam. Skin staples have been
removed.

Tech Notes:

Right Shoulder S/P RTSA. Pt states the pain is better some days than other.CS

## 2020-12-24 IMAGING — RF FL guided spine inject
1 series · 3 of 3 positions shown · non-contrast
Comparison: none

[Series 1: run · 3 of 3 slices shown]
[im 1/3]
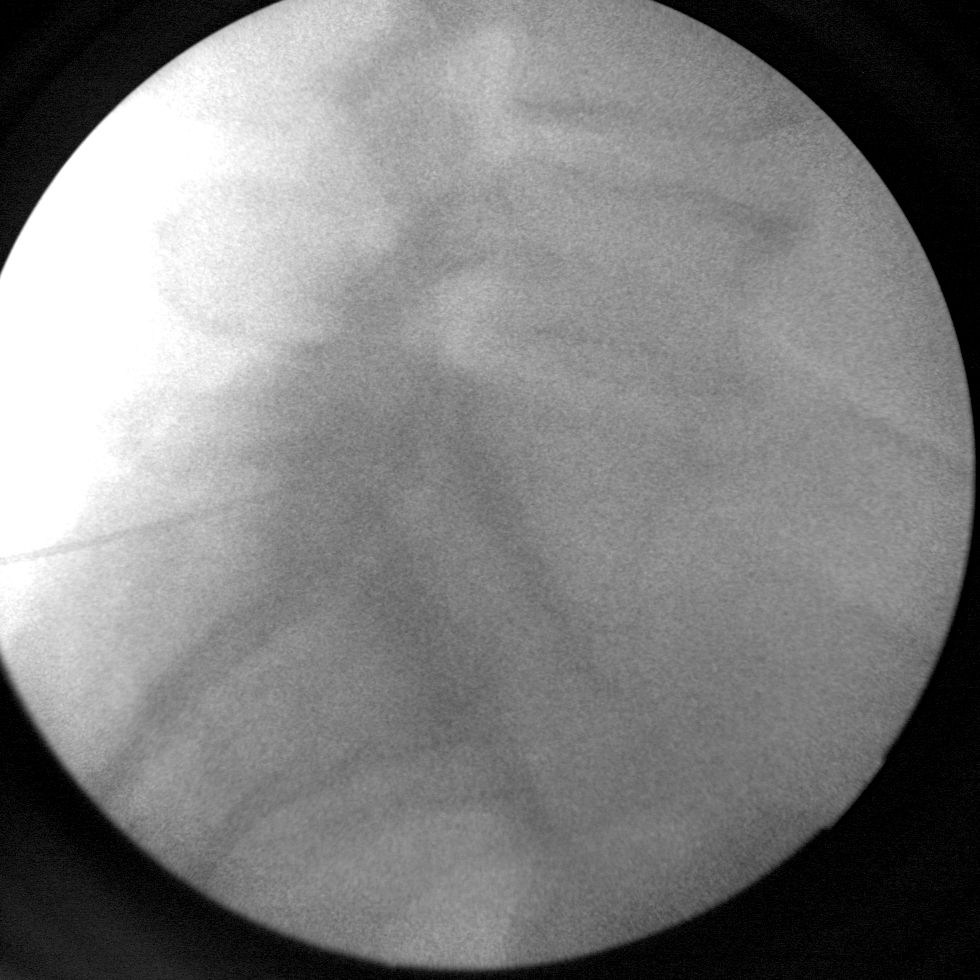
[im 2/3]
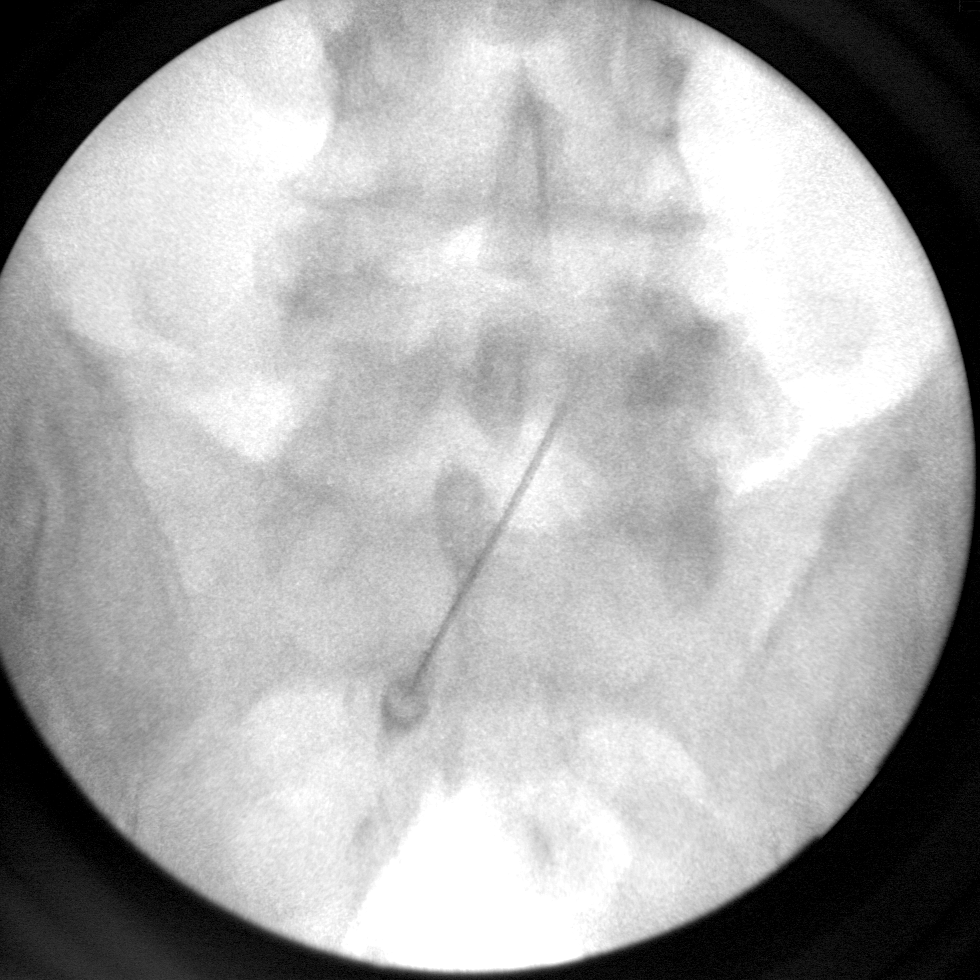
[im 3/3]
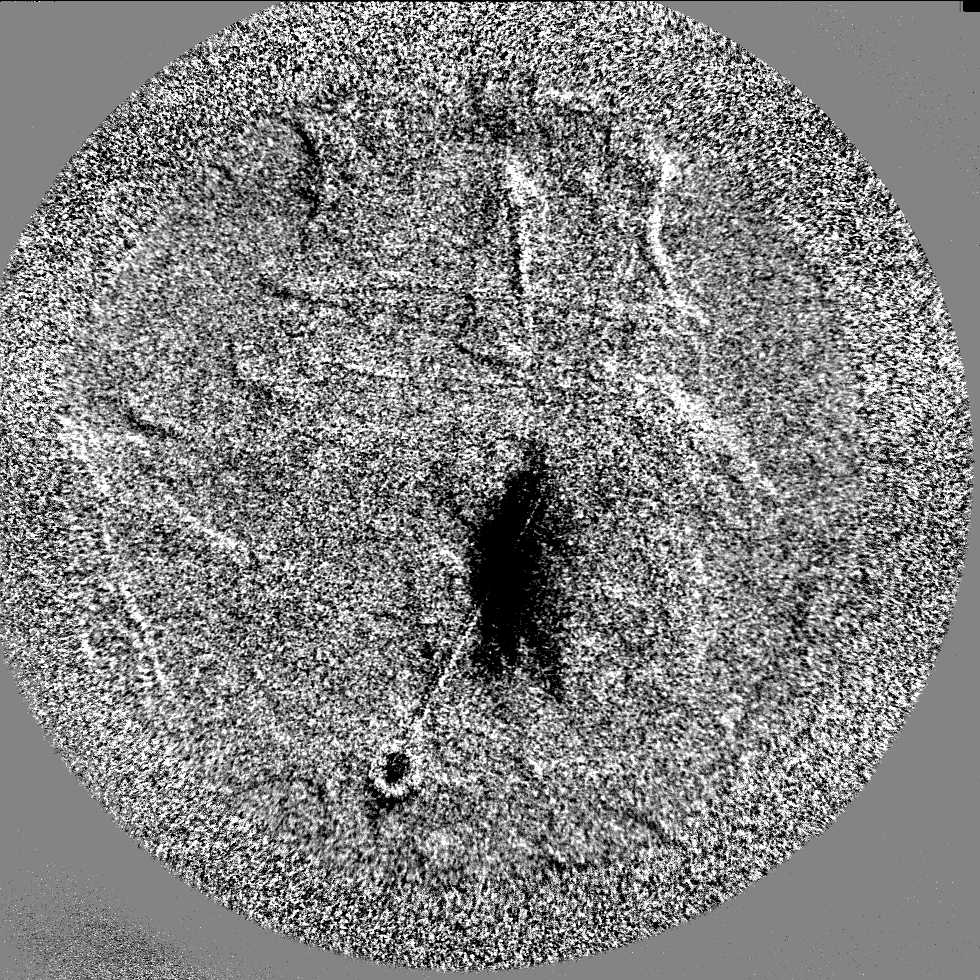

[3 of 3 positions shown; findings below may reference images not displayed]

DIAGNOSTIC STUDIES

EXAM

Fluoroscopic guidance for spinal injection.

INDICATION

MERO
MERO, OR 1, Dr Sunil, Fluoro Time 20.5 sec, 12.57 img. 3 img saved.CS

TECHNIQUE

Fluoro time is 20.5 seconds. 3 spot films were obtained.

COMPARISONS

None available

FINDINGS

Please see procedure note for full details. Fluoroscopic guidance was provided for spinal
injection.

IMPRESSION

Fluoroscopic guidance for spinal injection.

Tech Notes:

MERO, OR 1, Dr Sunil, Fluoro Time 20.5 sec, 12.57 img. 3 img saved.CS

## 2021-01-03 IMAGING — MR L-spine^Routine
5 series · 33 of 48 positions shown · non-contrast
Comparison: none

[Series 4: T2 · sagittal · 4.0mm · 0.62mm/px · 5 of 12 slices shown (1 of 2)]
[im 1/12]
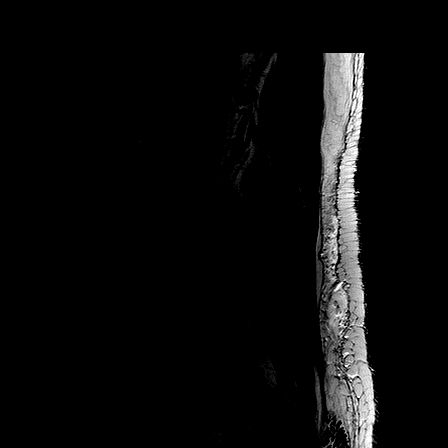
[im 3/12]
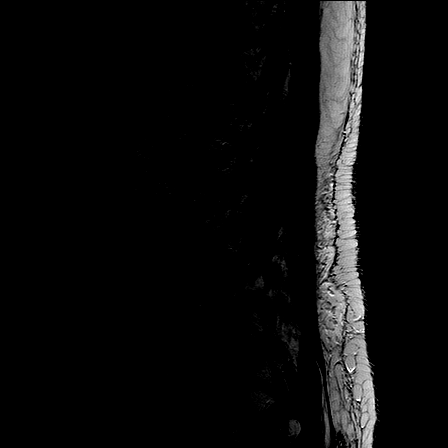
[im 6/12]
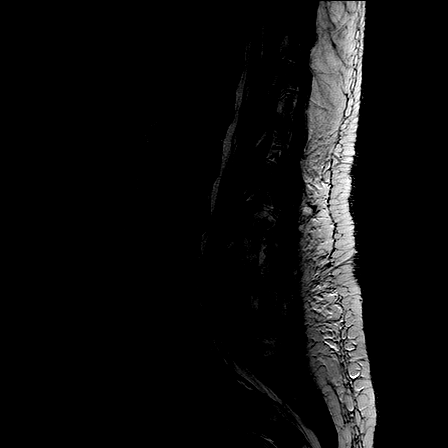
[im 9/12]
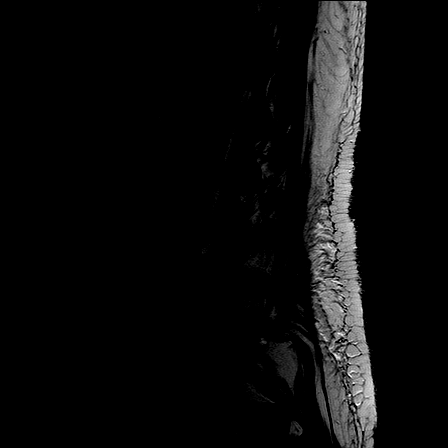
[im 12/12]
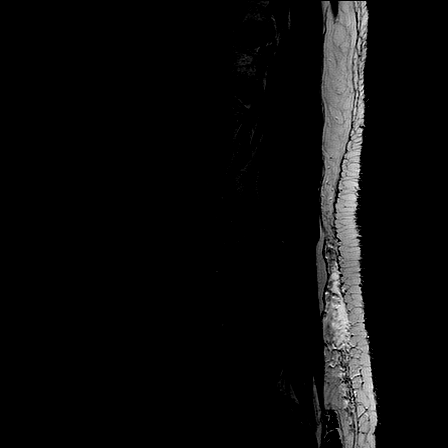

[Series 5: T1 · sagittal · 4.0mm · 0.73mm/px · 7 of 13 slices shown (1 of 2)]
[im 1/13]
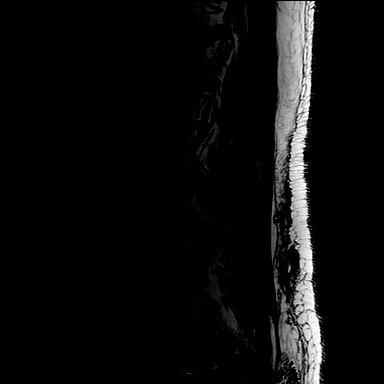
[im 3/13]
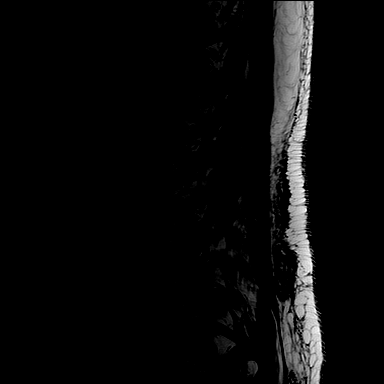
[im 5/13]
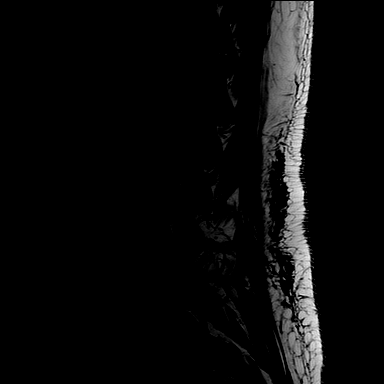
[im 7/13]
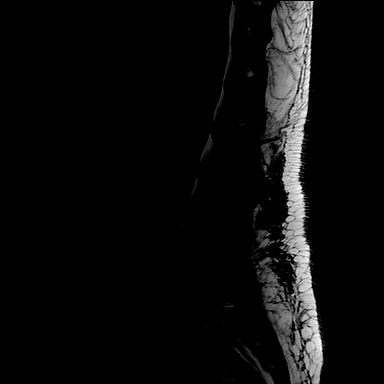
[im 9/13]
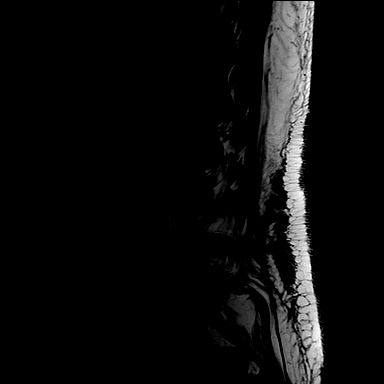
[im 11/13]
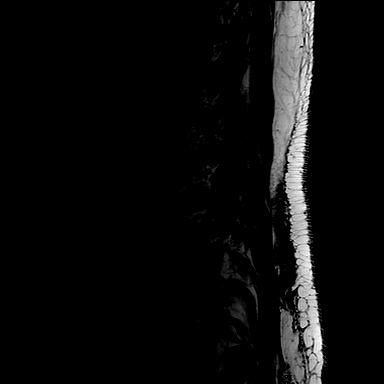
[im 13/13]
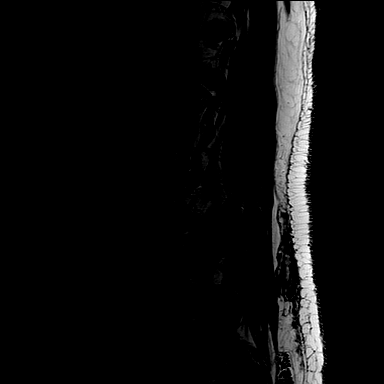

[Series 6: STIR · sagittal · 4.0mm · 0.55mm/px · 4 of 13 slices shown]
[im 1/13]
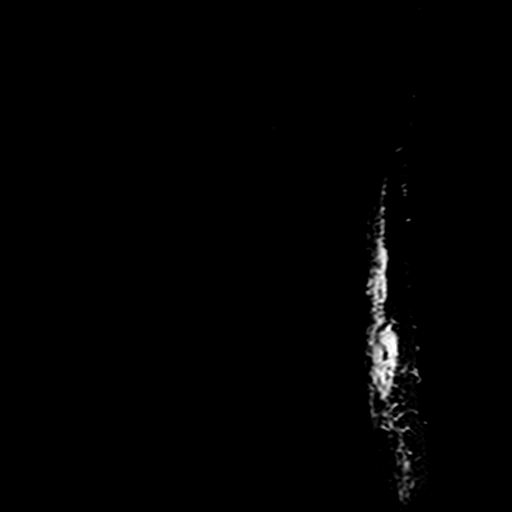
[im 3/13]
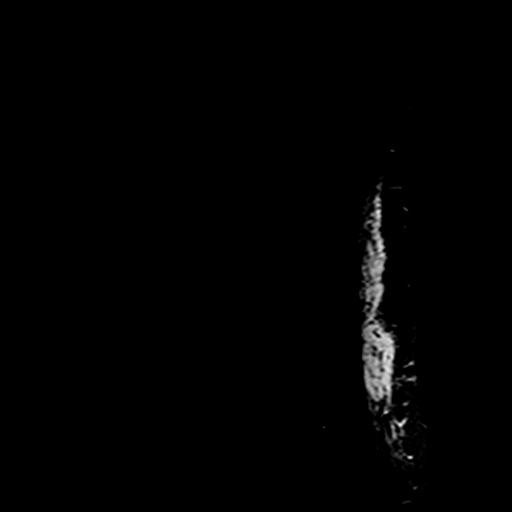
[im 5/13]
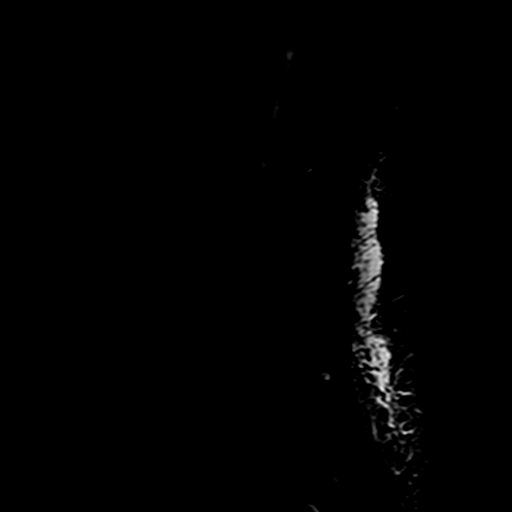
[im 7/13]
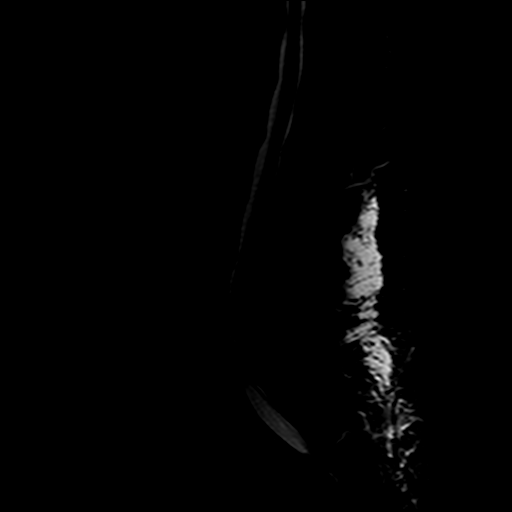

[Series 7: T2 · axial · 4.5mm · 0.49mm/px · z∈[-131,+61]mm · 9 of 29 slices shown (2 of 2)]
[im 1/29]
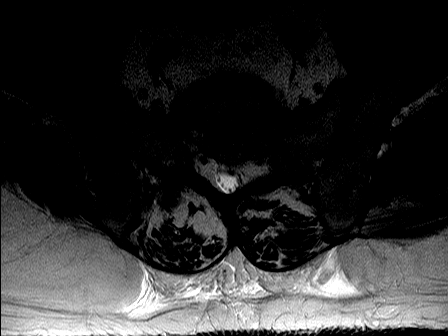
[im 5/29]
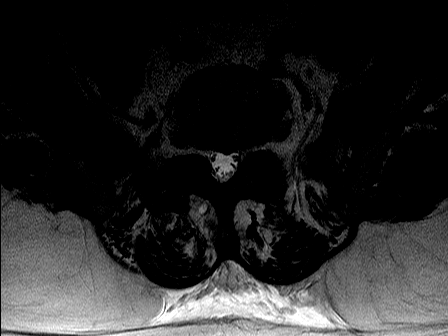
[im 9/29]
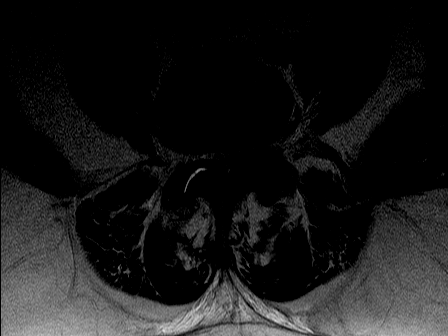
[im 13/29]
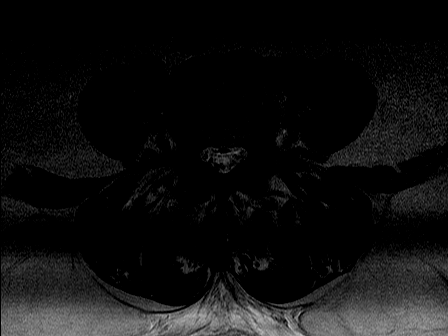
[im 15/29]
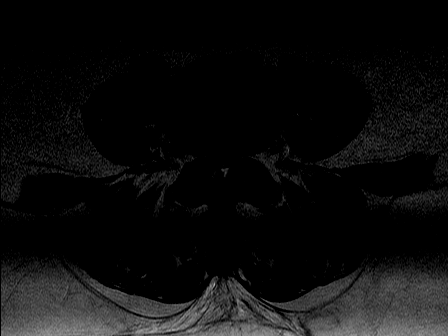
[im 17/29]
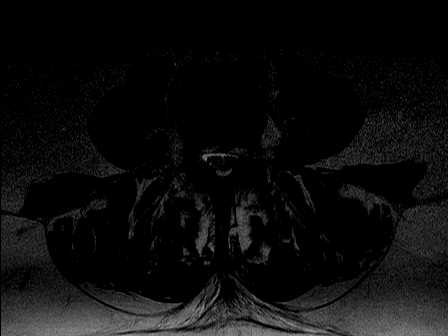
[im 21/29]
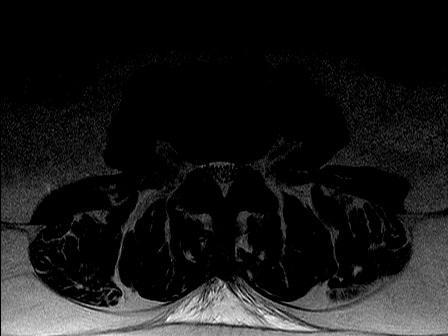
[im 25/29]
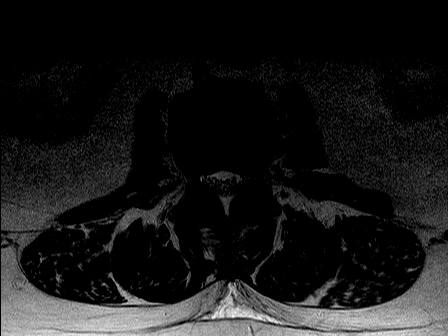
[im 29/29]
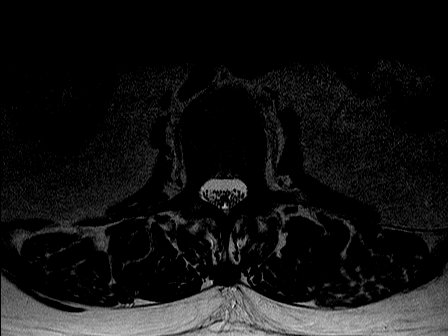

[Series 8: T1 · axial · 4.5mm · 0.86mm/px · z∈[-131,+61]mm · 8 of 27 slices shown (2 of 2)]
[im 1/27]
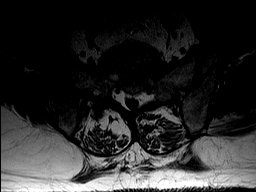
[im 5/27]
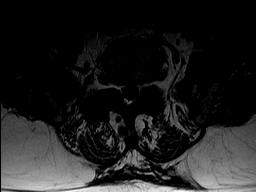
[im 9/27]
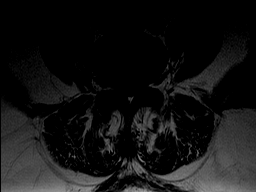
[im 13/27]
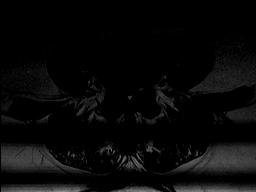
[im 15/27]
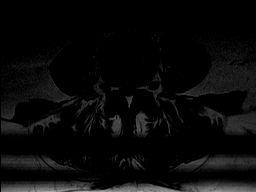
[im 19/27]
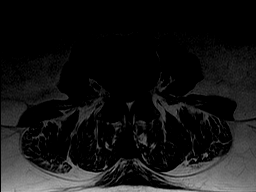
[im 23/27]
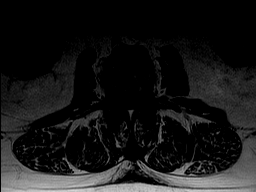
[im 27/27]
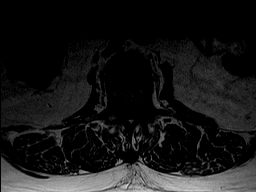

[33 of 48 positions shown; findings below may reference images not displayed]

EXAM

MR lumbar spine wo con

INDICATION

CHRONIC LOW BACK PAIN, FREDDY JOSE 2 WEEKS AGO WITH NO IMPROVEMENT.  RG

TECHNIQUE

MRI of the lumbar spine was performed with multiplanar fast spin echo sequences. No intravenous
contrast was administered.

COMPARISONS

None available at the time of dictation.

FINDINGS

Anatomy: Normal lordotic curvature of the lumbar spine. No spondylolisthesis.

Vertebral bodies: No  compression fracture.

T12-L1: No disc herniation. No spinal canal narrowing or neural foraminal narrowing.

L1-L2: No disc herniation.  No spinal canal narrowing or neural foraminal narrowing.

L2-L3: A small concentric disc bulges present no spinal canal narrowing or neural foraminal
narrowing.

L3-L4: A concentric disc bulge is present. Ligamentum flavum hypertrophy is present. Compression of
the nerve roots. Severe spinal canal stenosis is seen. Mild bilateral neural foraminal narrowing.
Severe facet arthropathy.

L4-L5: Decreased intervertebral disc space with a right paracentral disc bulge. Ligamentum flavum
hypertrophy. Severe spinal canal stenosis with compression of the nerve roots. No neural foraminal
stenosis. Severe facet arthropathy.

L5-S1: No disc herniation.  No spinal canal narrowing or neural foraminal narrowing. Severe facet
arthropathy.

Visualized sacrum and bony pelvis: Normal bone marrow signal of the visualized sacrum and bony
pelvis.

Other findings: No abnormality of the visualized abdomen/pelvis.

IMPRESSION

Degenerative disc changes resulting in up to severe spinal canal stenosis at the levels of L3-L4

Severe facet arthropathy.

Note: The following findings are so common in people without low back pain that while we report
their presence, they must be interpreted with caution and in the context of the clinical situation.
(Reference --Jarvik et al, Spine 7770)

Findings (prevalence in patients without low back pain)

Disc degeneration (decreased T2 signal, height loss, bulge) (91%)

Disc T2 - signal loss (83%)

Disc height loss (56%)

Disc bulge (64%)

Dis protrusion (32%)

Annular tear (38 [IP_ADDRESS]

Tech Notes:

CHRONIC LOW BACK PAIN, FREDDY JOSE 2 WEEKS AGO WITH NO IMPROVEMENT.  RG

## 2021-01-16 IMAGING — RF FL guided spine inject
1 series · 5 of 5 positions shown · non-contrast
Comparison: none

[Series 1: run · 2 acquisitions, 5 frames shown]
[im 1/2]
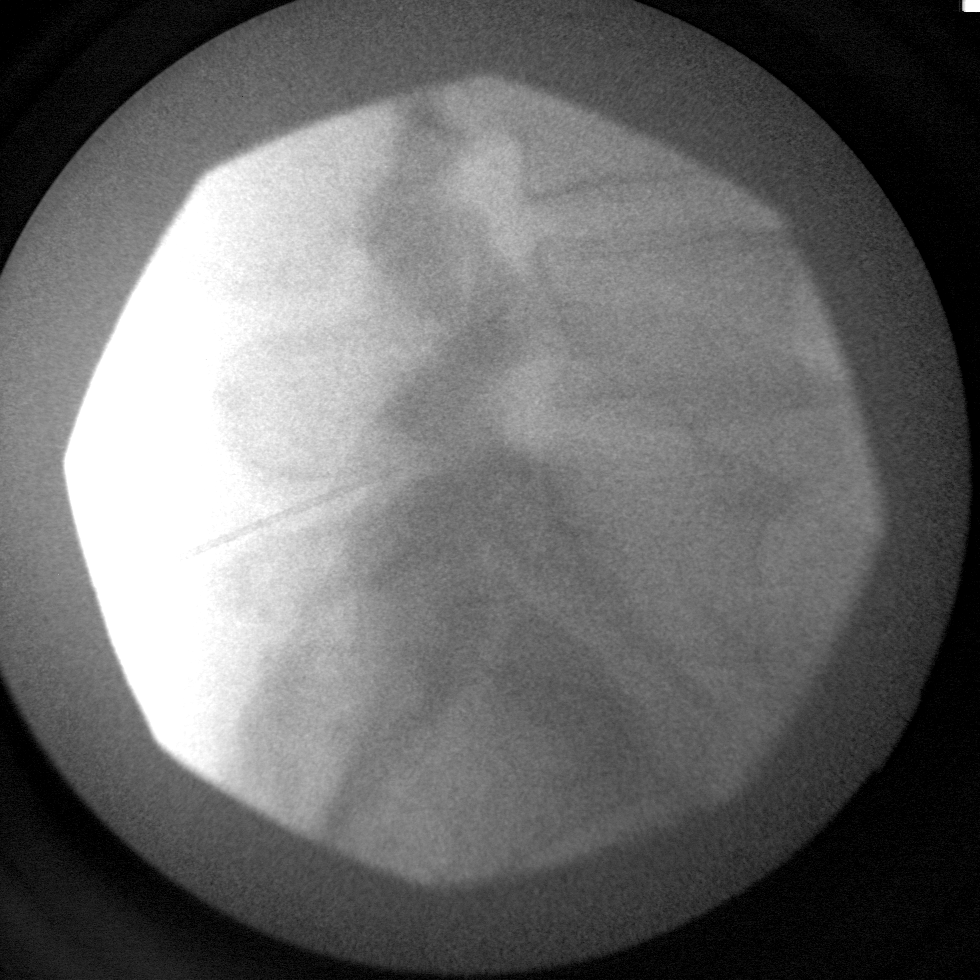
[im 2/2]
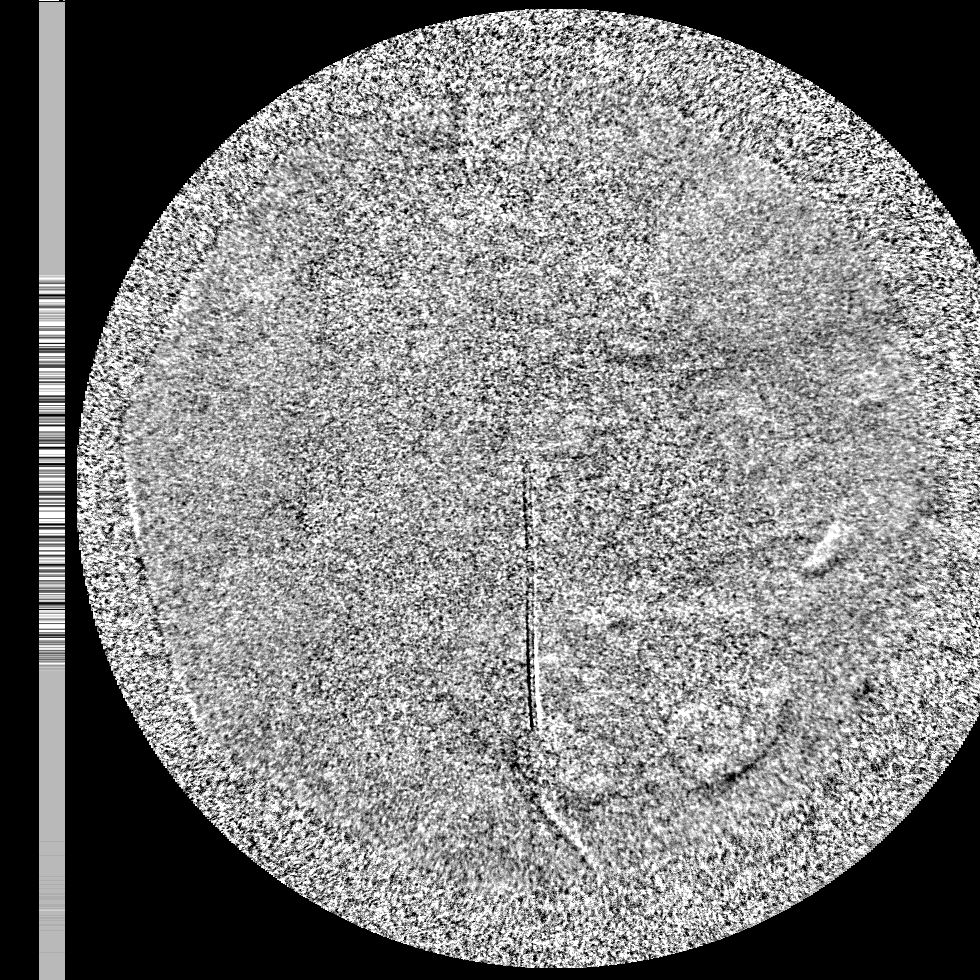
[im 2/2]
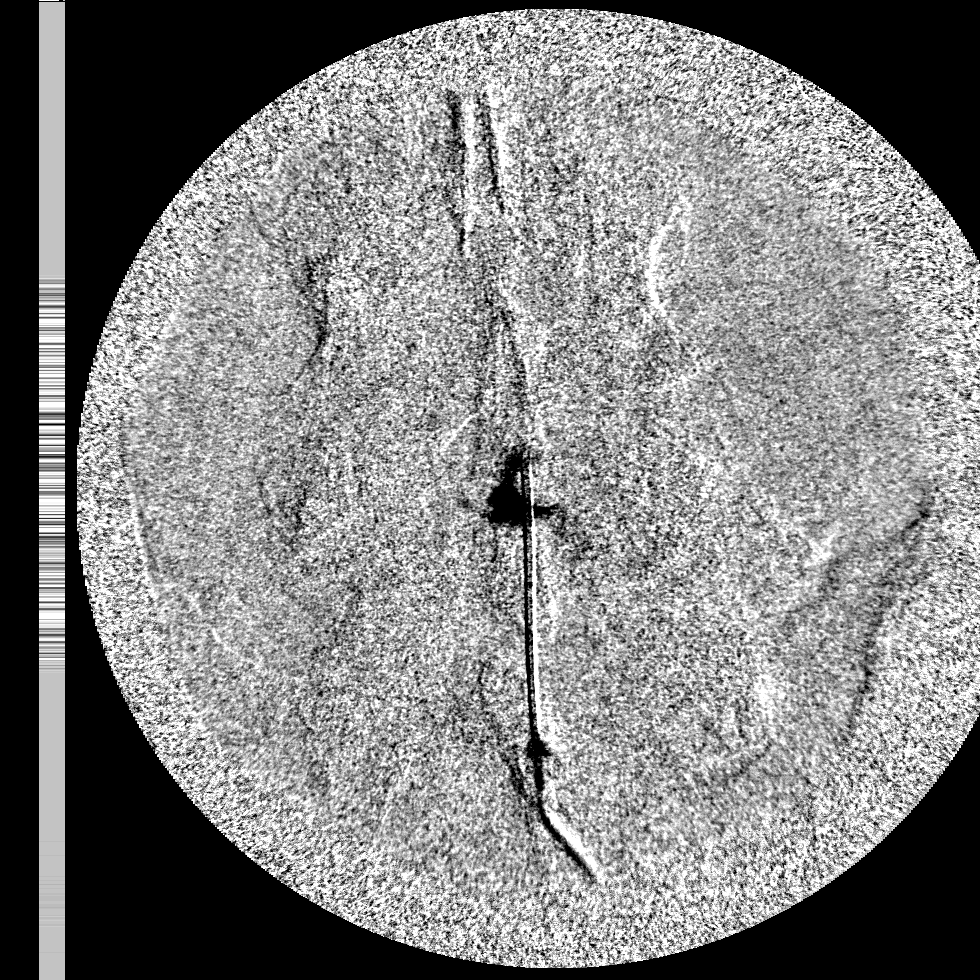
[im 2/2]
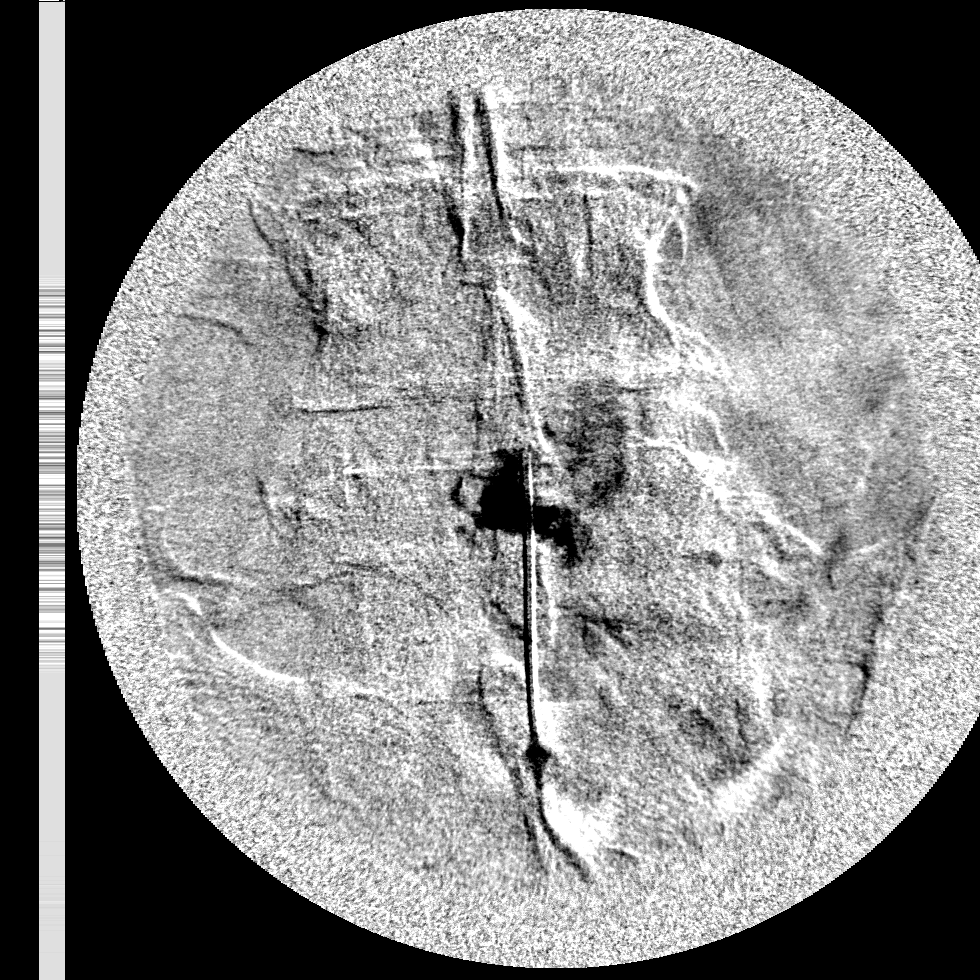
[im 2/2]
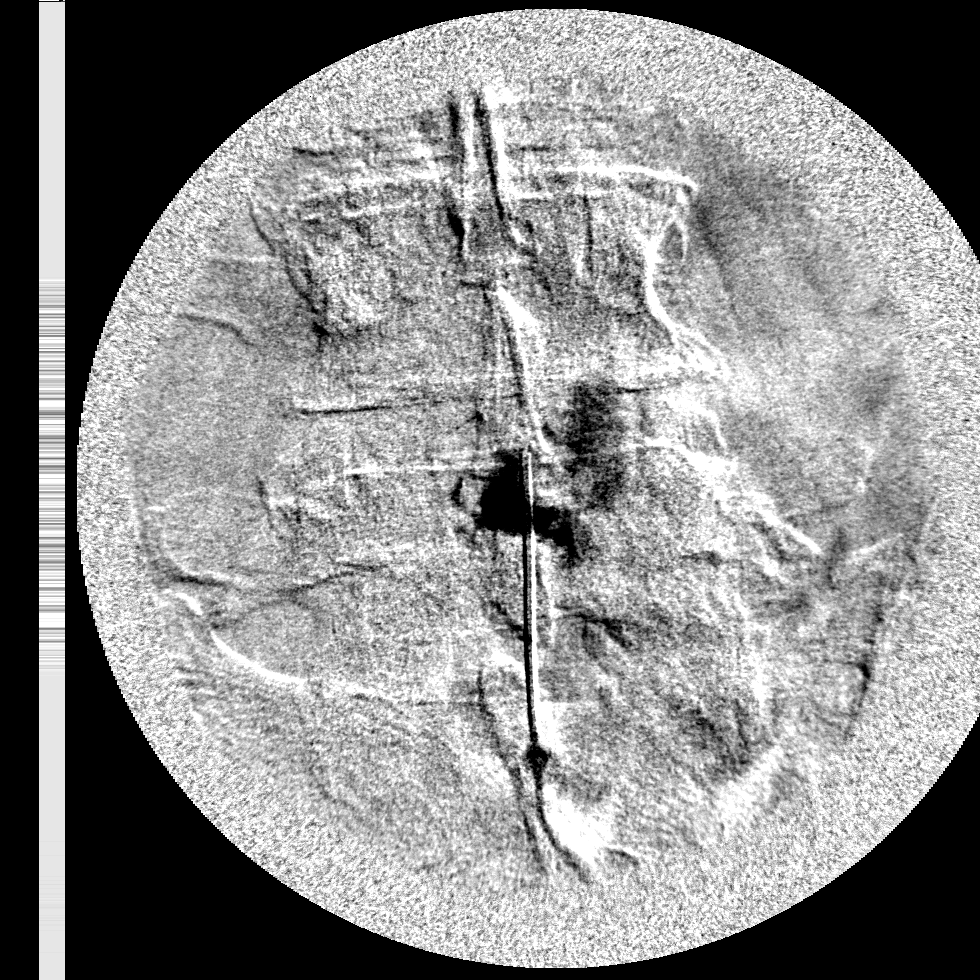

[5 of 5 positions shown; findings below may reference images not displayed]

EXAM

FL guided spine inject

INDICATION

KNEPPER
KNEPPER in OR, Dr Carolann,  CS

TECHNIQUE

Fluoro time 15 sec, 8.82 mGy. Single image and single cine clip

COMPARISONS

None available

FINDINGS

Intraprocedural images for lumbar spine epidural steroid injection. Please see the operative report
for further details.

IMPRESSION

Intraprocedural images for lumbar spine epidural steroid injection. Please see the operative report
for further details.

Tech Notes:

KNEPPER in OR, Dr Carolann, Fluoro time 15 sec, 8.82 mGy. CS

## 2021-02-13 IMAGING — RF FL guided spine inject
1 series · 5 of 5 positions shown · non-contrast
Comparison: none

[Series 1: run · 2 acquisitions, 5 frames shown]
[im 1/2]
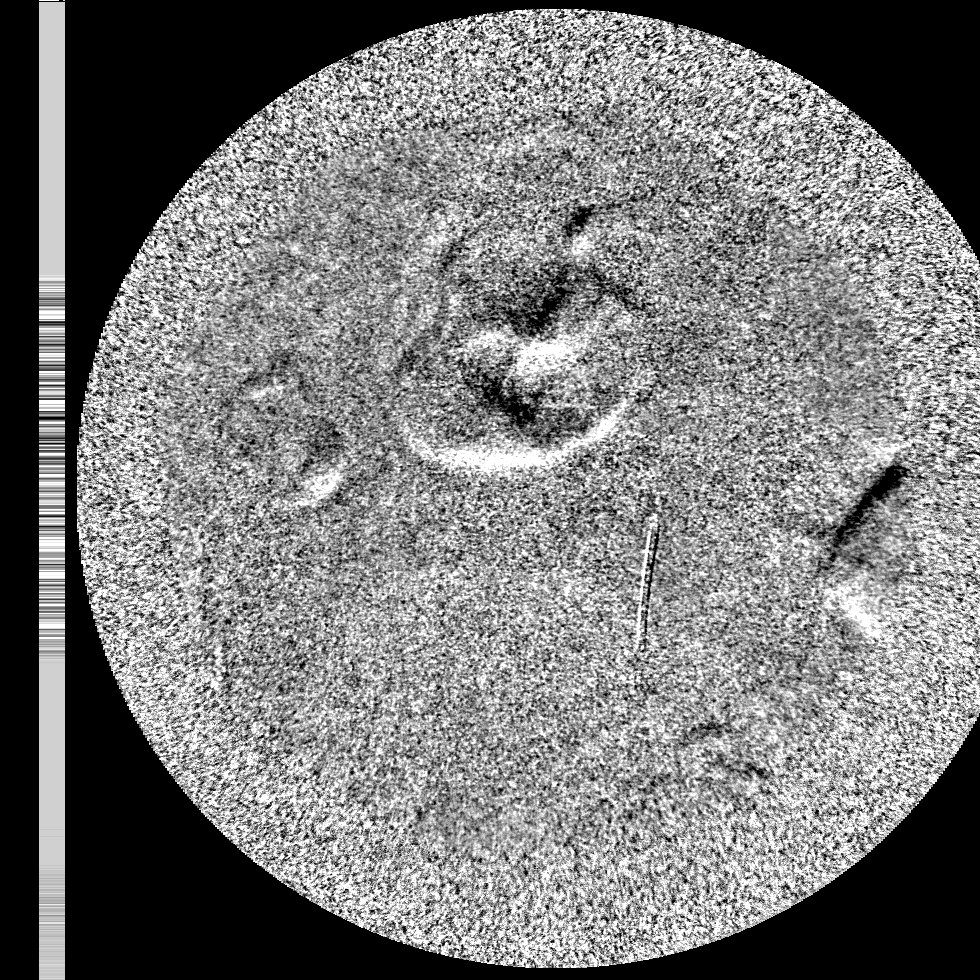
[im 1/2]
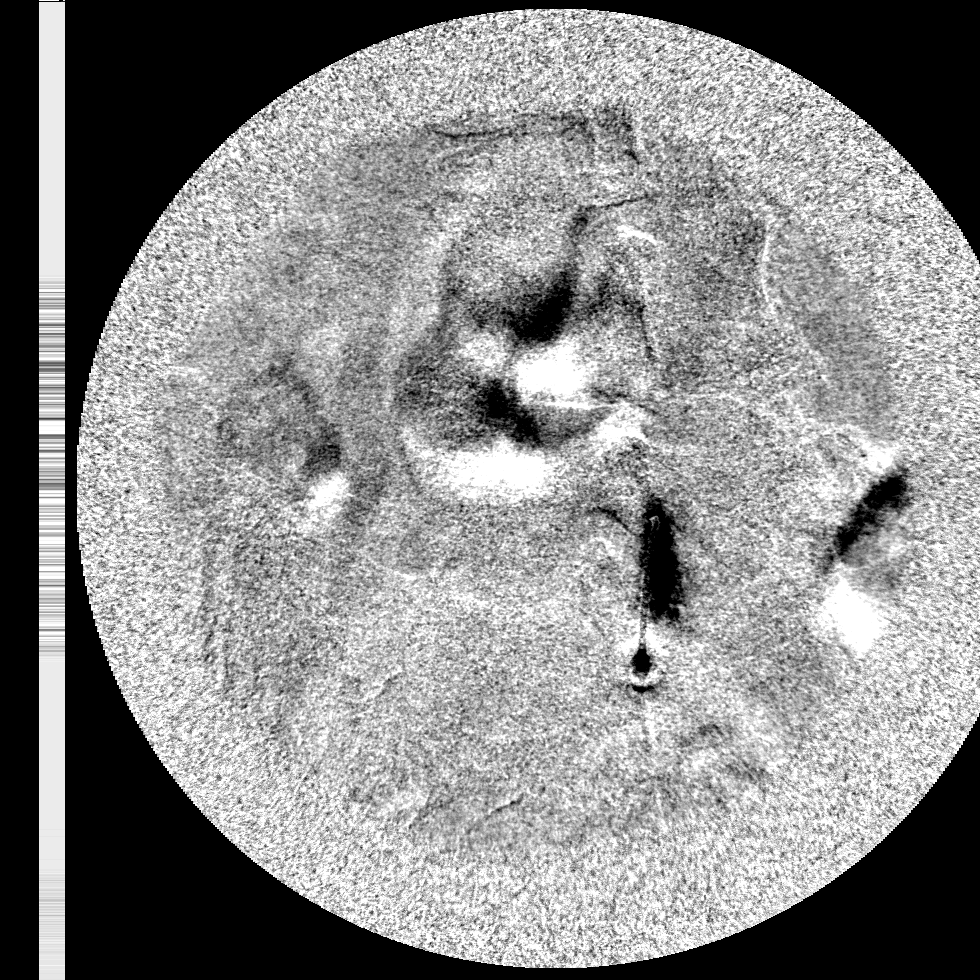
[im 1/2]
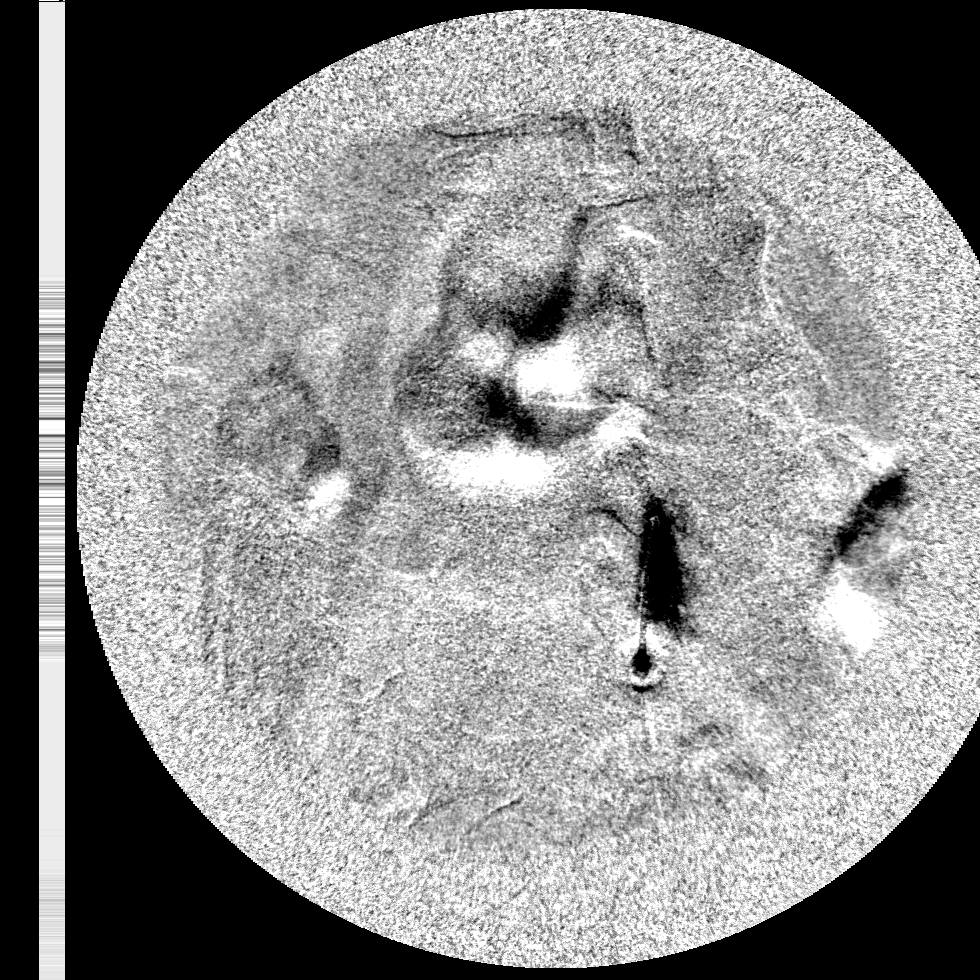
[im 1/2]
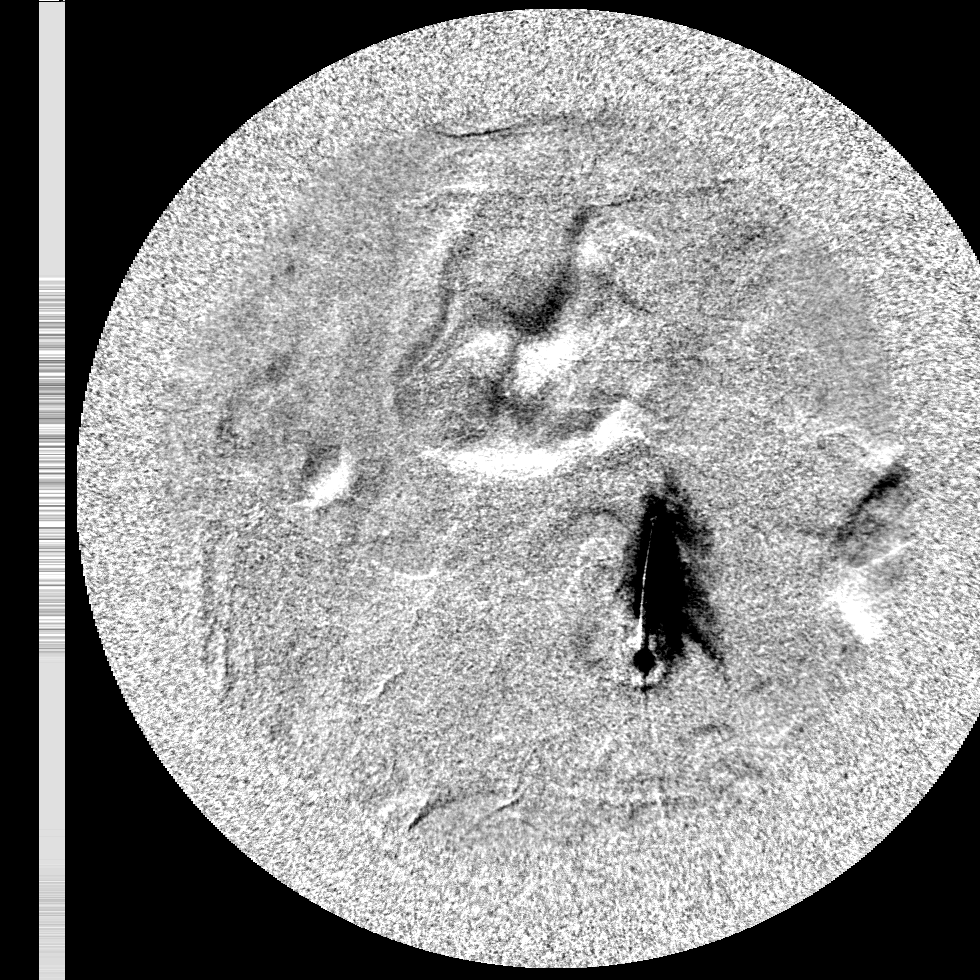
[im 2/2]
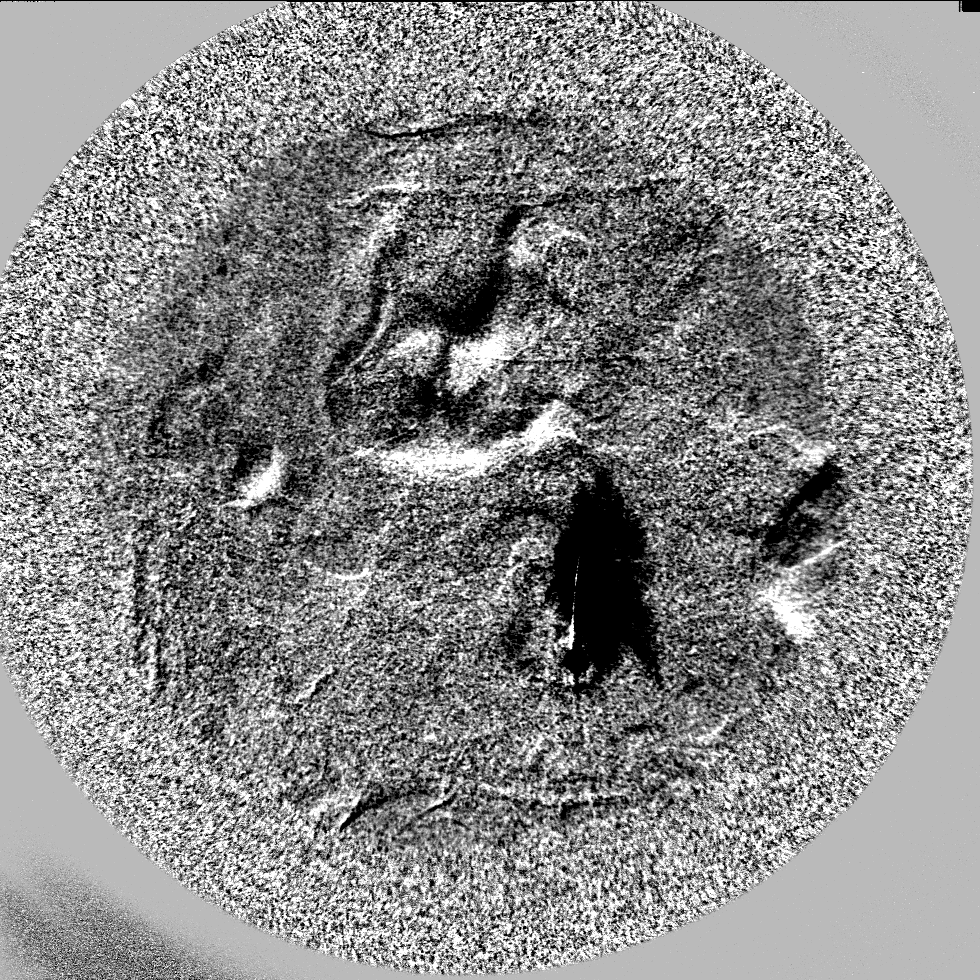

[5 of 5 positions shown; findings below may reference images not displayed]

DIAGNOSTIC STUDIES

EXAM

Fluoroscopic guidance for spinal injection.

INDICATION

OXENDINE
OXENDINE in OR , Dr Lihansen, Fluoro time 15.1 secs 9.93 mGy. CS

TECHNIQUE

Fluoro time is 15.1 seconds. Cine clip and 2 spot films were obtained.

COMPARISONS

January 16, 2021

FINDINGS

Please see procedure note for full details. Fluoroscopic guidance was provided for spinal
injection.

IMPRESSION

Fluoroscopic guidance for spinal injection.

Tech Notes:

OXENDINE in OR , Dr Lihansen, Fluoro time 15.1 secs 9.93 mGy. CS

## 2021-05-29 IMAGING — CR SHOULDCMRT
2 series · 2 of 2 positions shown · non-contrast
Comparison: none

[shoulder external]
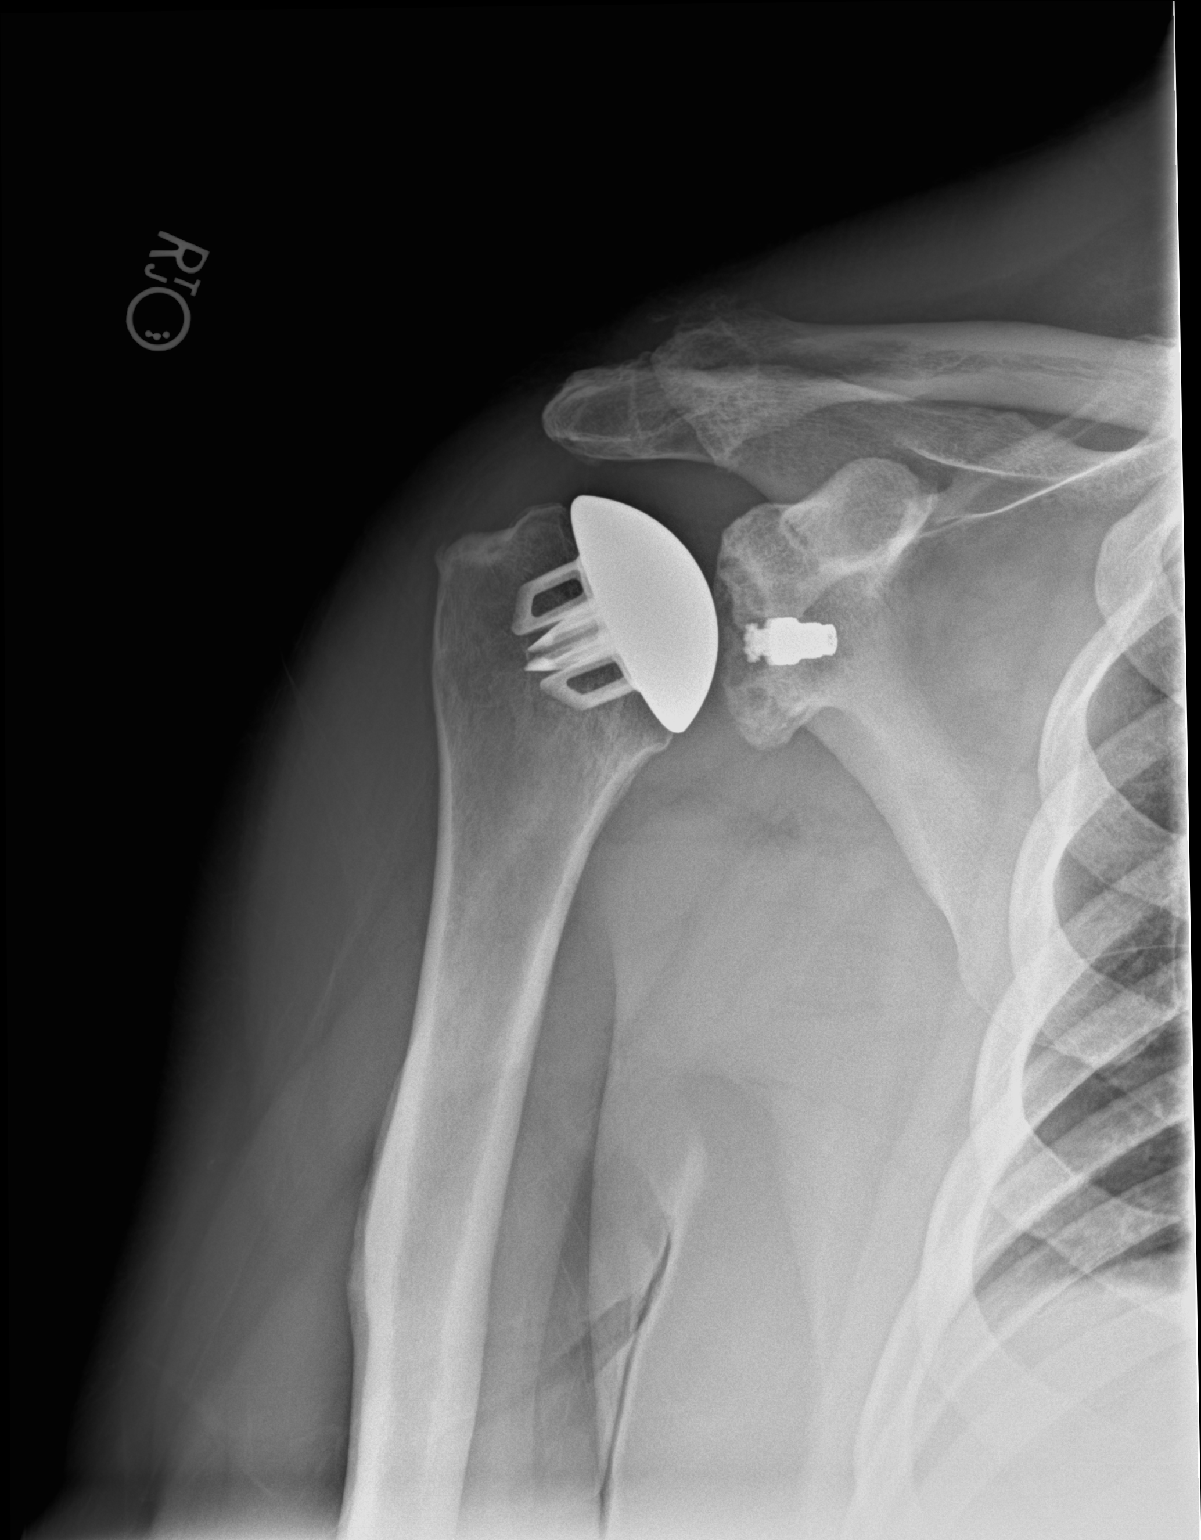

[shoulder y-view]
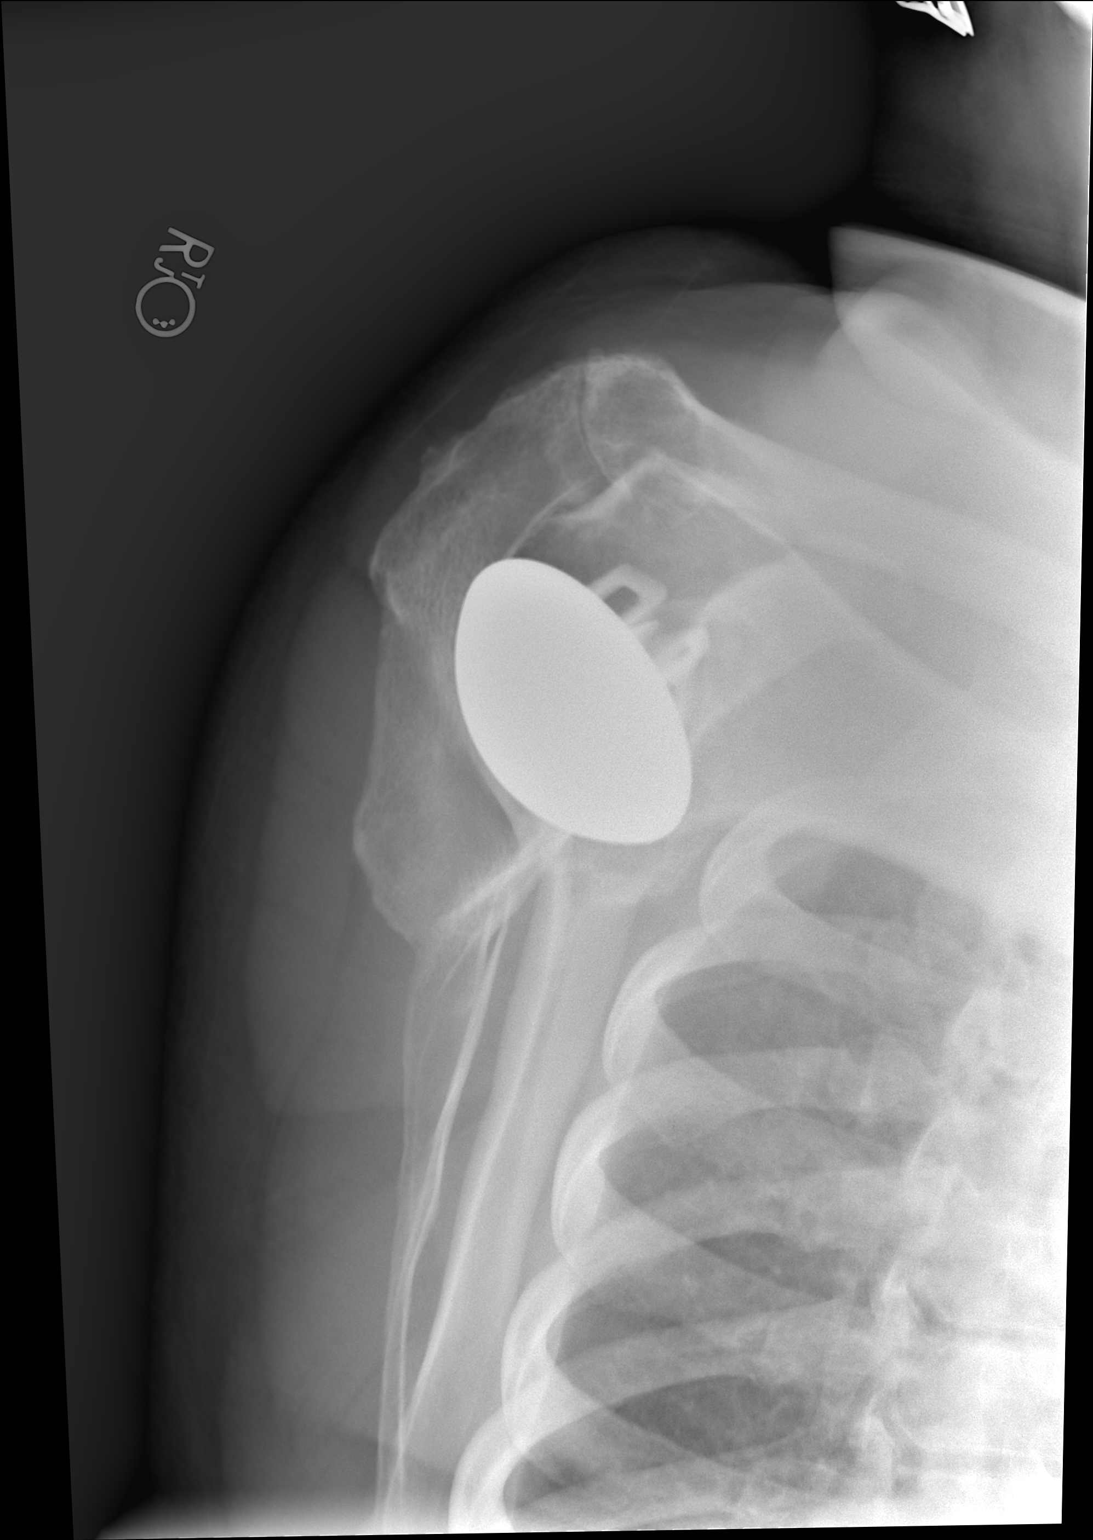

[2 of 2 positions shown; findings below may reference images not displayed]

DIAGNOSTIC STUDIES

EXAM

XR shoulder right, complete

INDICATION

Right shoulder pain
PT STATES F/U RIGHT SHOULDER. TJ

TECHNIQUE

AP lateral and axillary views

COMPARISONS

May 15, 2021

FINDINGS

Right shoulder arthroplasty is noted without change in appearance or alignment. No fractures are
seen.

IMPRESSION

Stable appearance of right shoulder arthroplasty.

Tech Notes:

PT STATES F/U RIGHT SHOULDER. TJ

## 2021-05-29 IMAGING — CR [ID]
3 series · 3 of 3 positions shown · non-contrast
Comparison: none

[hand pa]
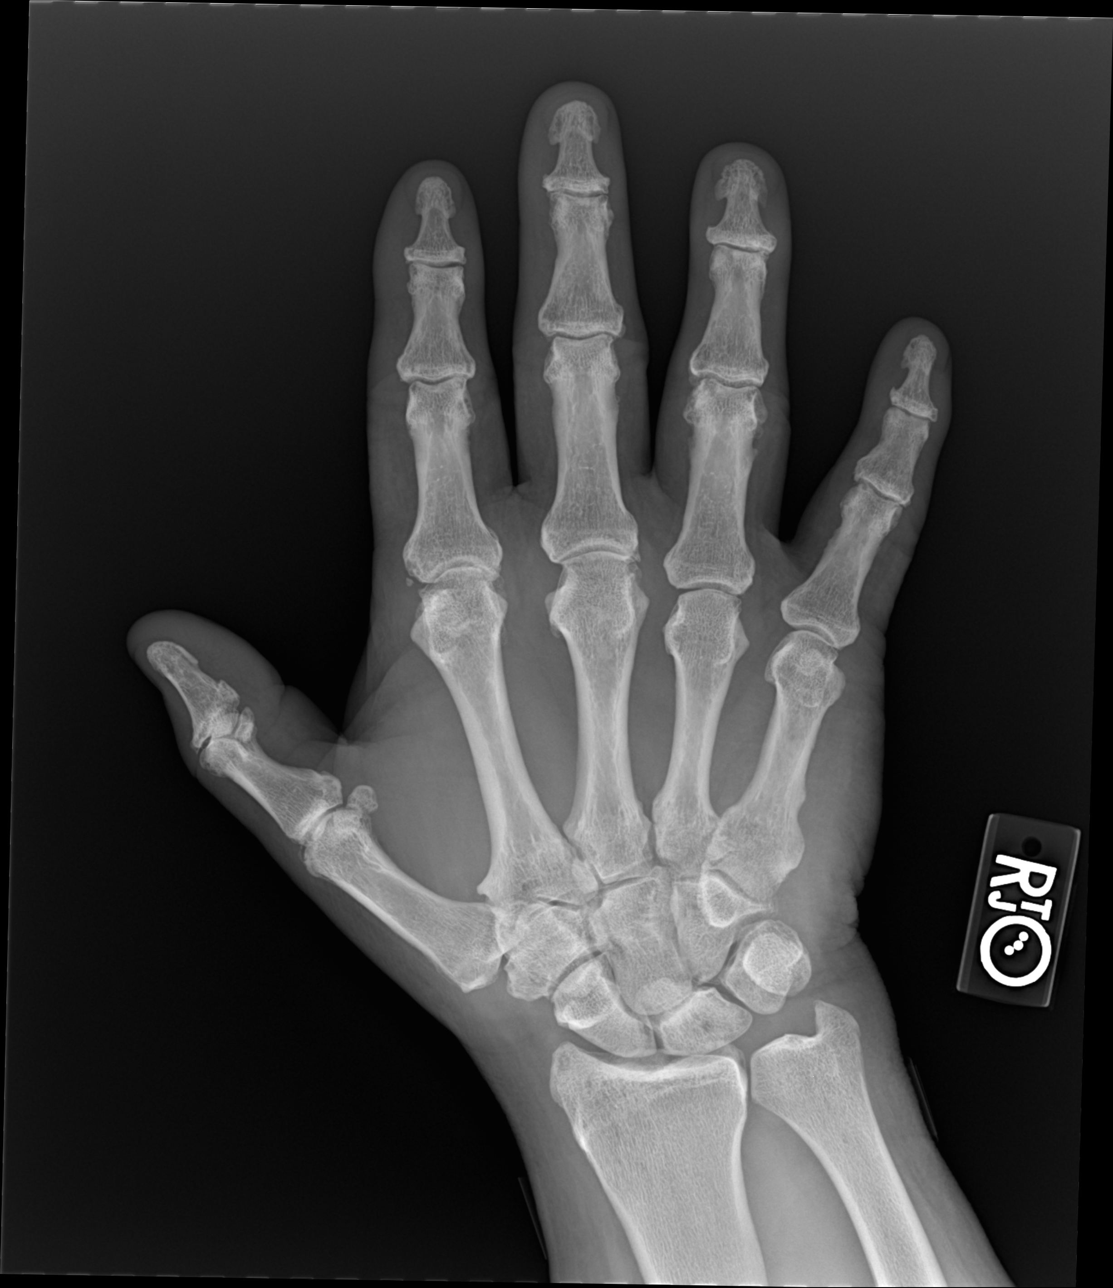

[hand obl]
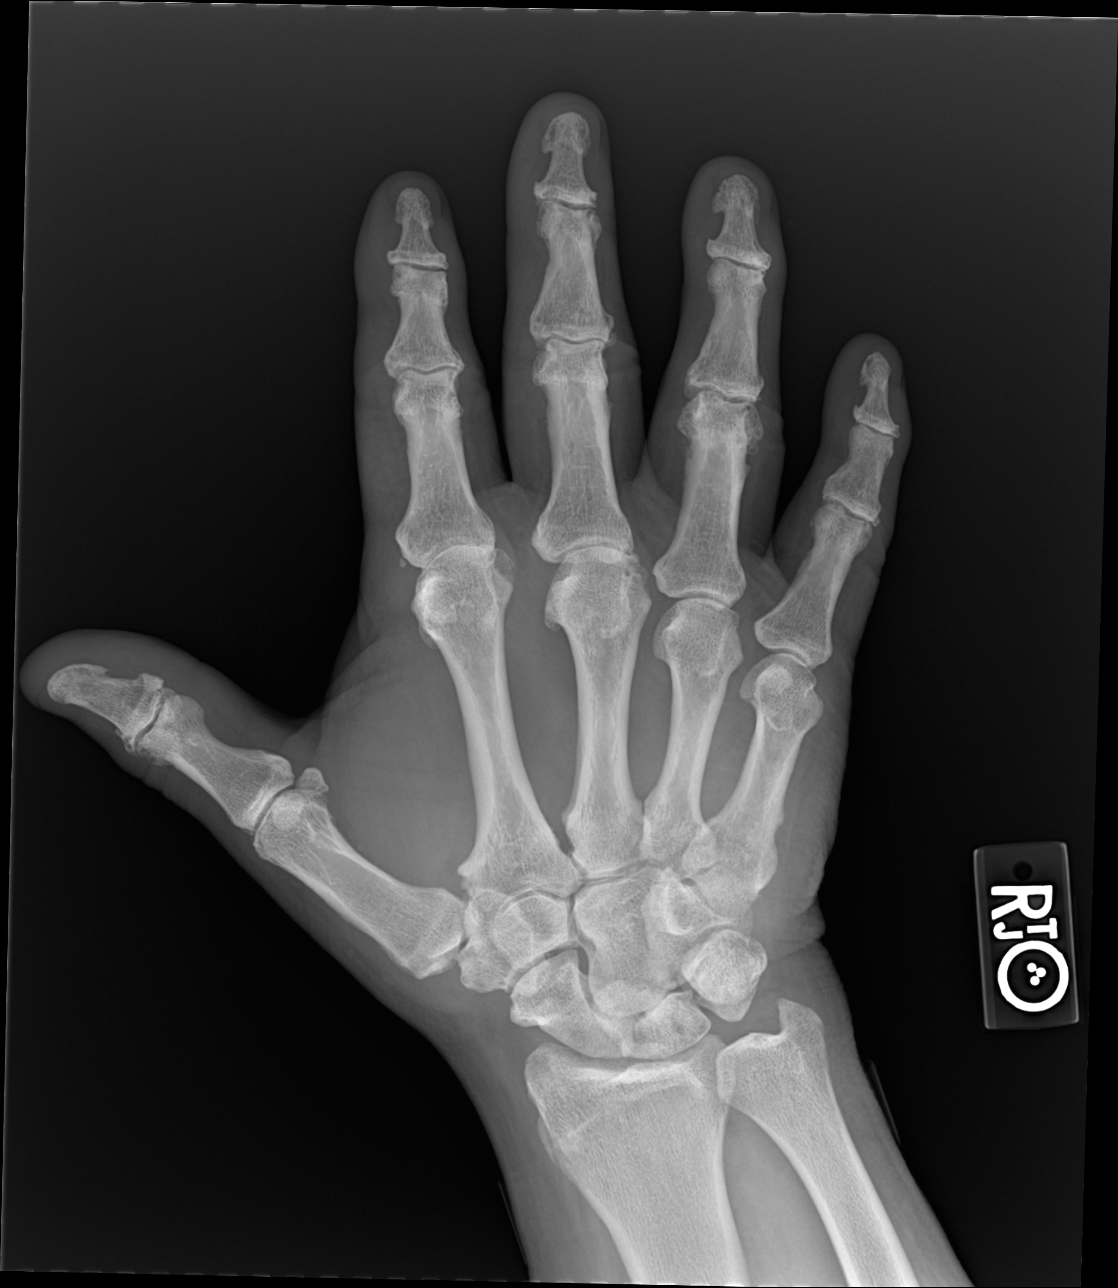

[hand lat]
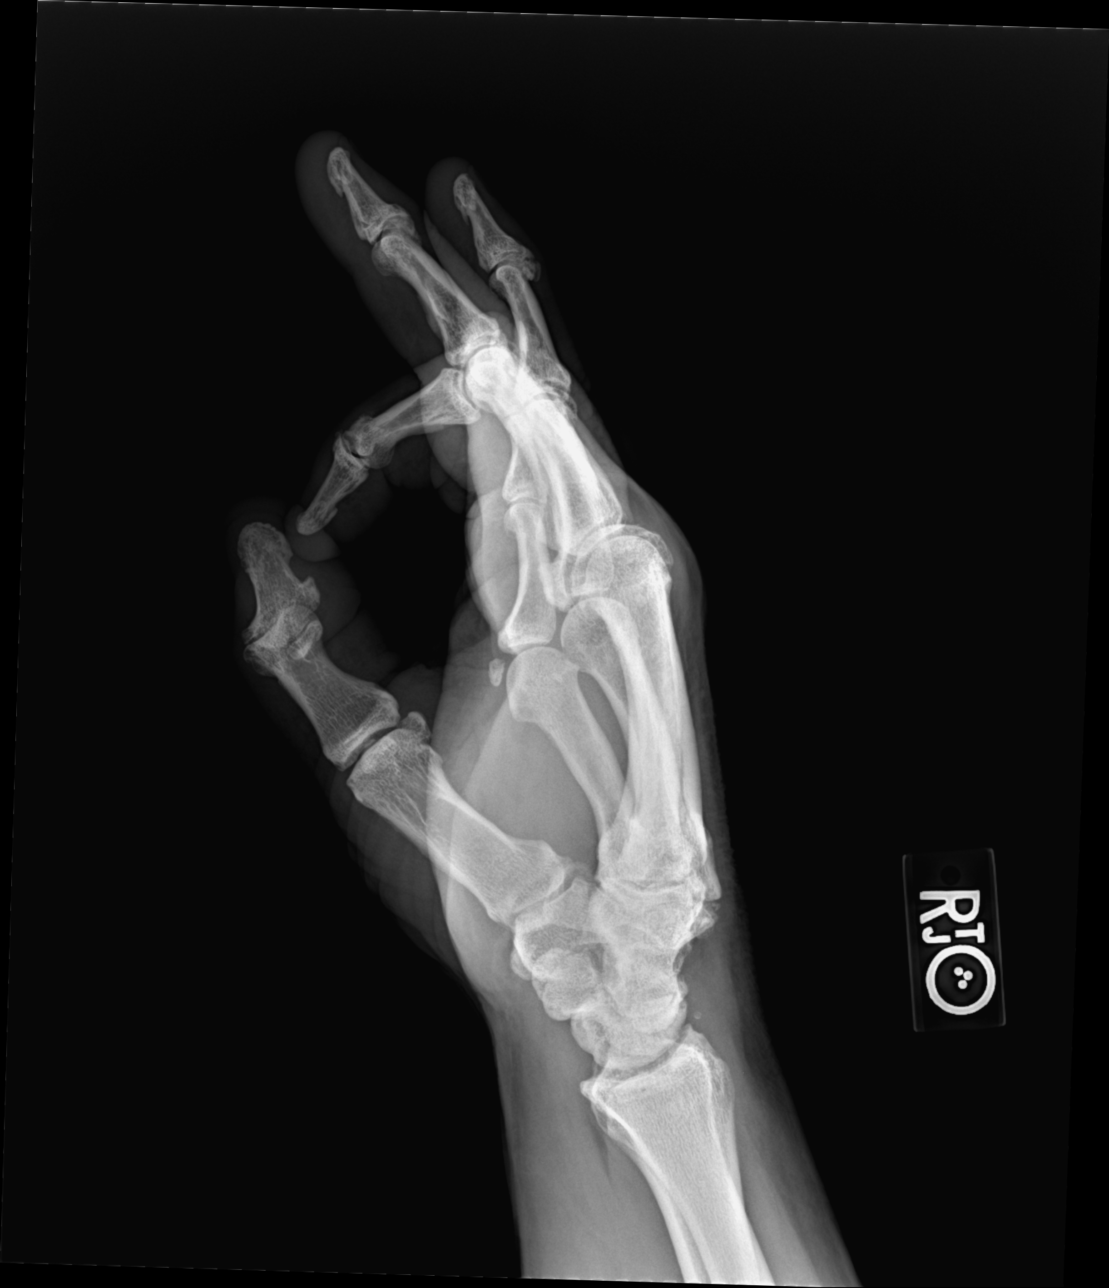

[3 of 3 positions shown; findings below may reference images not displayed]

DIAGNOSTIC STUDIES

EXAM

XR hand RT min 3V

INDICATION

bilateral hand pain
PT STATES HAS BILATERAL HAND, 3RD DIGIT TRIGGER FINGER. LEFT >RIGHT. TJ

TECHNIQUE

AP lateral and oblique views

COMPARISONS

None available

FINDINGS

Osteoarthritic changes are noted involving the DIP and PIP joints as well as the MCP joints. No
fractures are evident. Minor degenerative changes of the 1st carpometacarpal joint space and
scaphoid trapezium joint are noted.

IMPRESSION

Degenerative changes as described. No fractures are seen.

Tech Notes:

PT STATES HAS BILATERAL HAND, 3RD DIGIT TRIGGER FINGER. LEFT >RIGHT. TJ

## 2021-05-29 IMAGING — CR [ID]
2 series · 2 of 2 positions shown · non-contrast
Comparison: none

[hand pa]
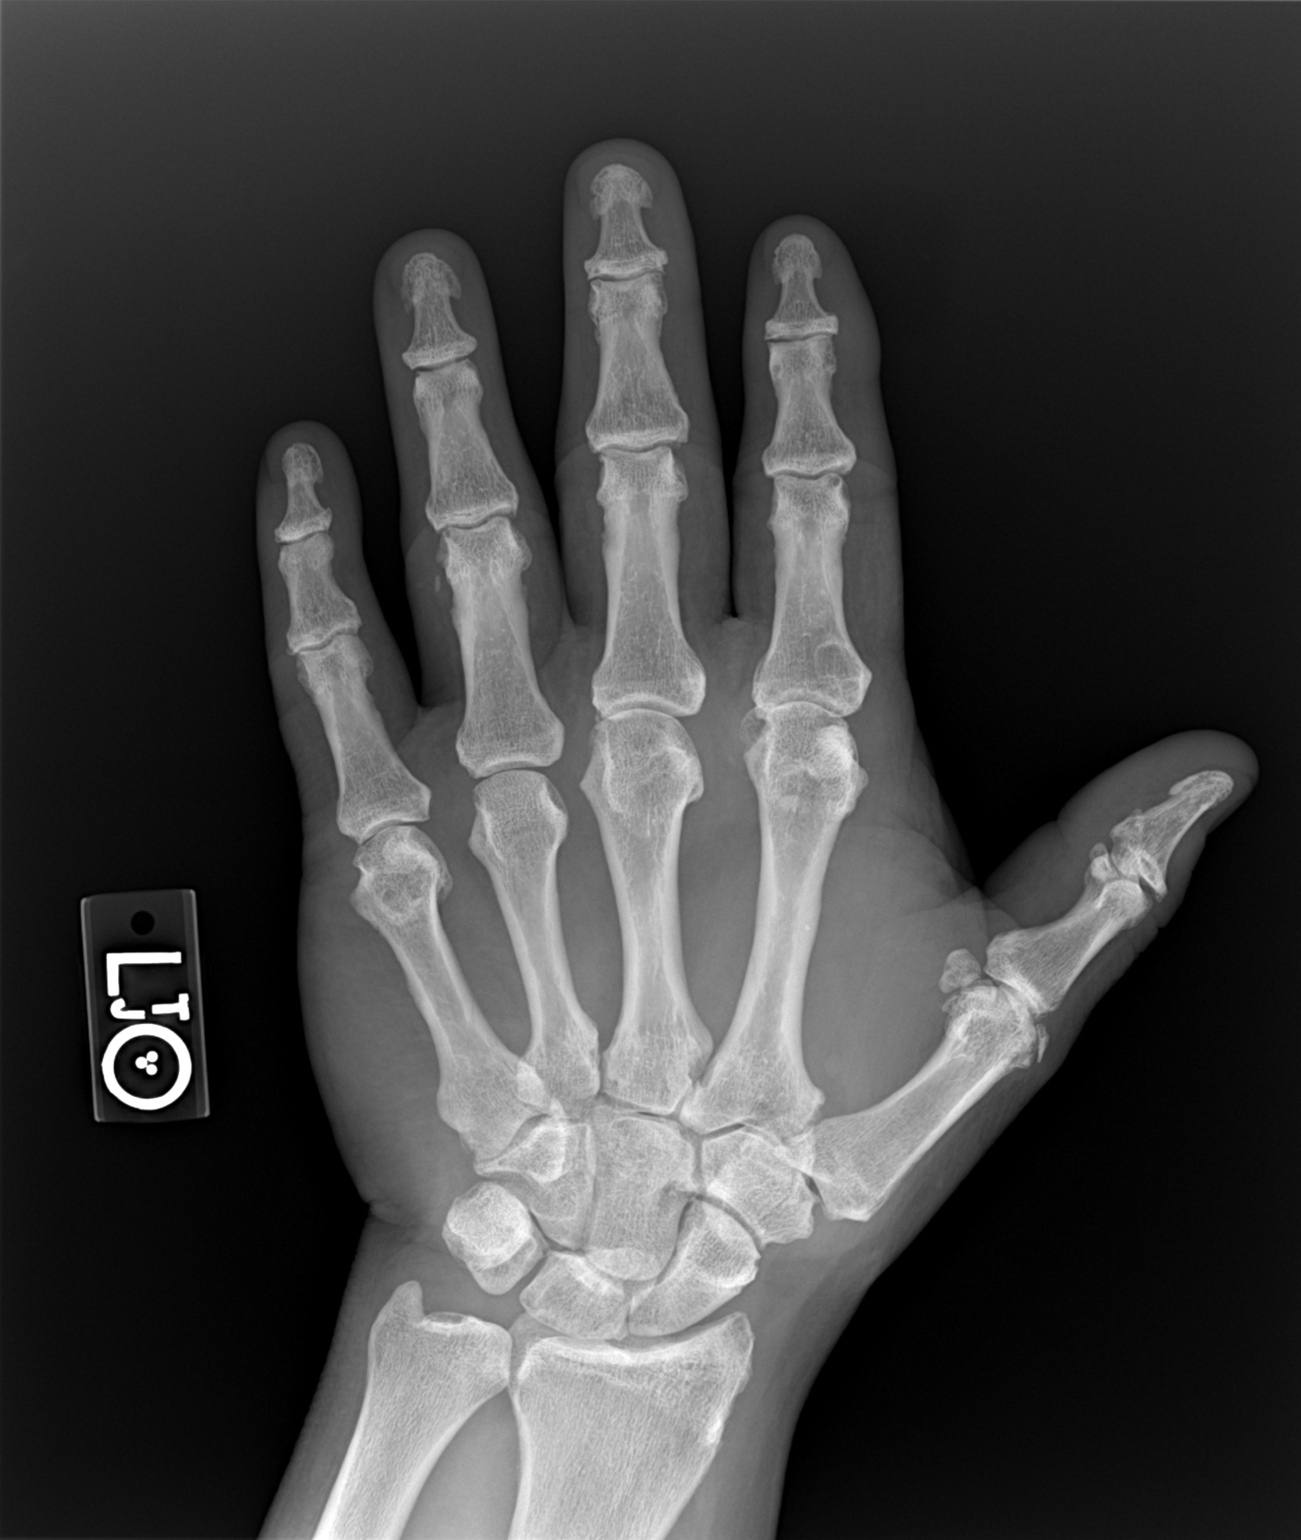

[hand lat]
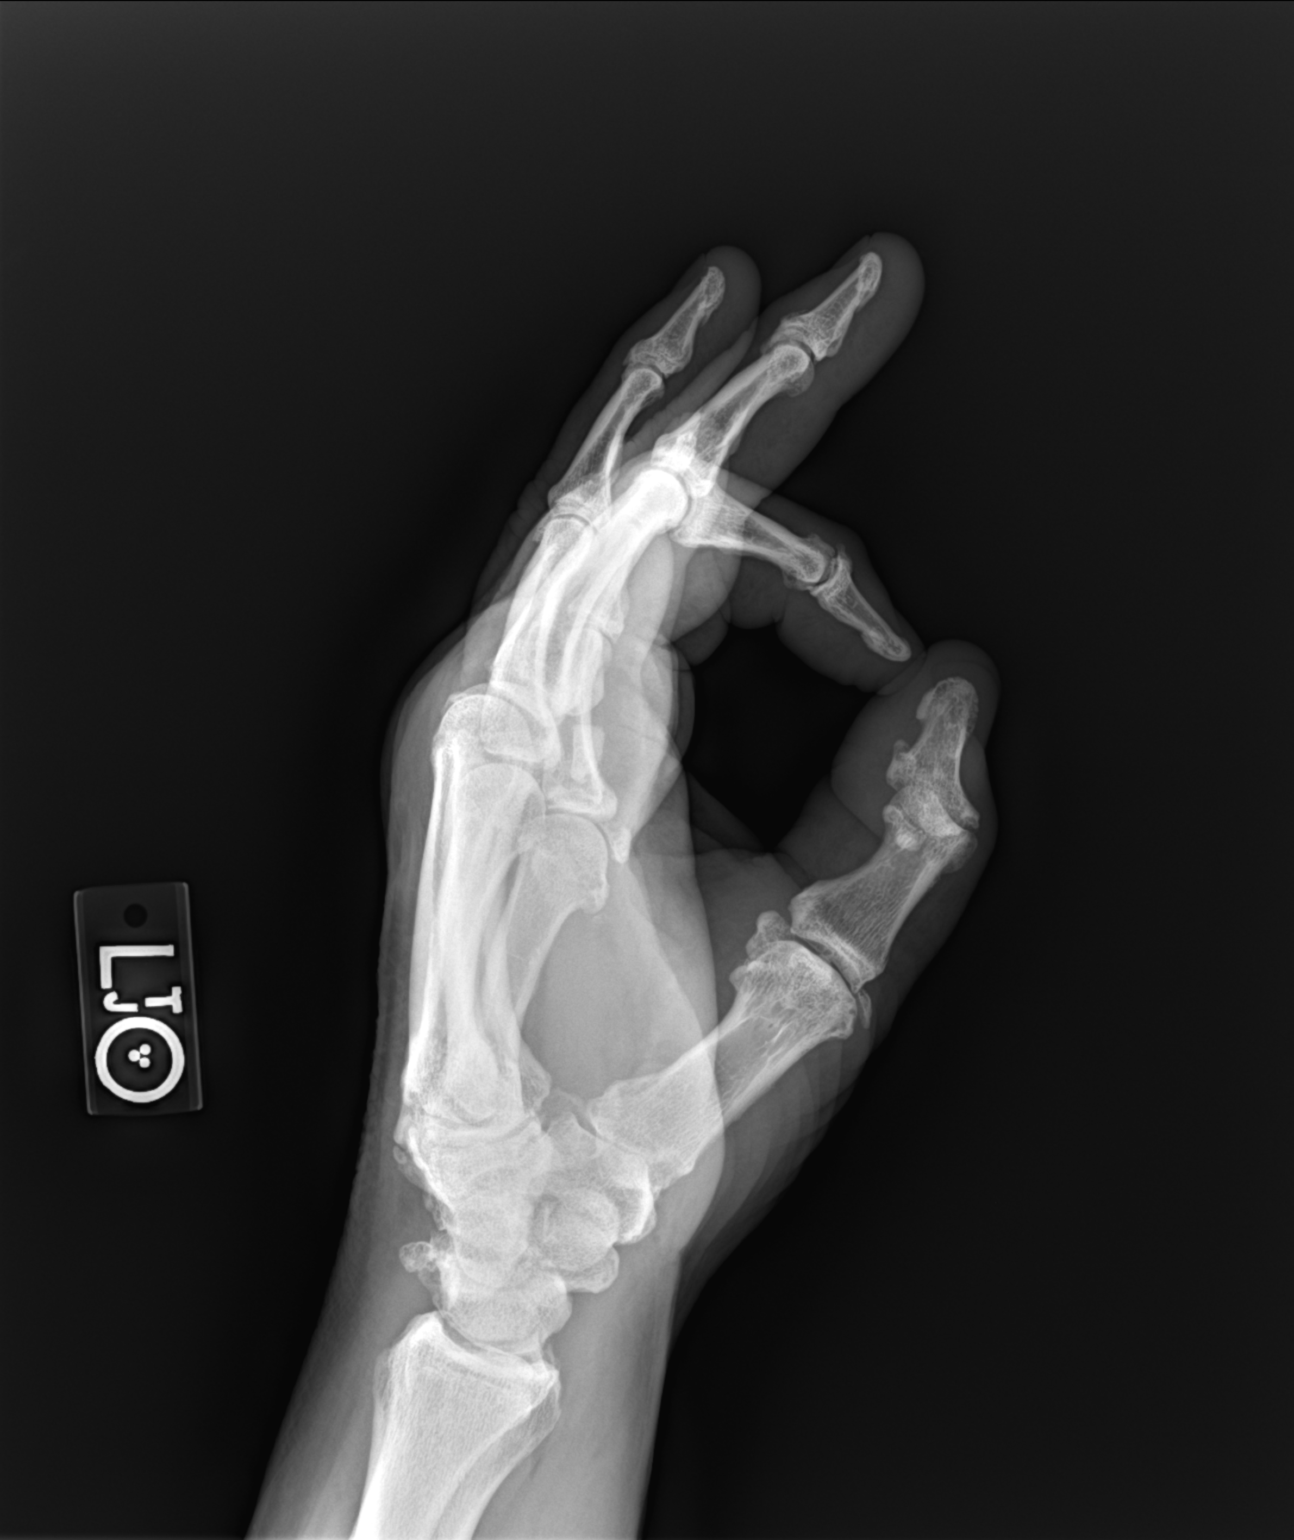

[2 of 2 positions shown; findings below may reference images not displayed]

DIAGNOSTIC STUDIES

EXAM

XR hand LT min 3V

INDICATION

bilateral hand pain
PT STATES HAS BILATERAL HAND, 3RD DIGIT TRIGGER FINGER. LEFT >RIGHT. TJ

TECHNIQUE

AP lateral and oblique views

COMPARISONS

None available

FINDINGS

Osteoarthritic changes are evident involving the DIP and PIP joints as well as the MCP joints. No
fractures are seen. Mild degenerative changes of the radiocarpal joint space are also evident.

IMPRESSION

Osteoarthritic changes as described. No fractures are seen.

Tech Notes:

PT STATES HAS BILATERAL HAND, 3RD DIGIT TRIGGER FINGER. LEFT >RIGHT. TJ

## 2021-08-07 IMAGING — CR HIPCMRT
3 series · 3 of 3 positions shown · non-contrast
Comparison: none

[t pelvis ap]
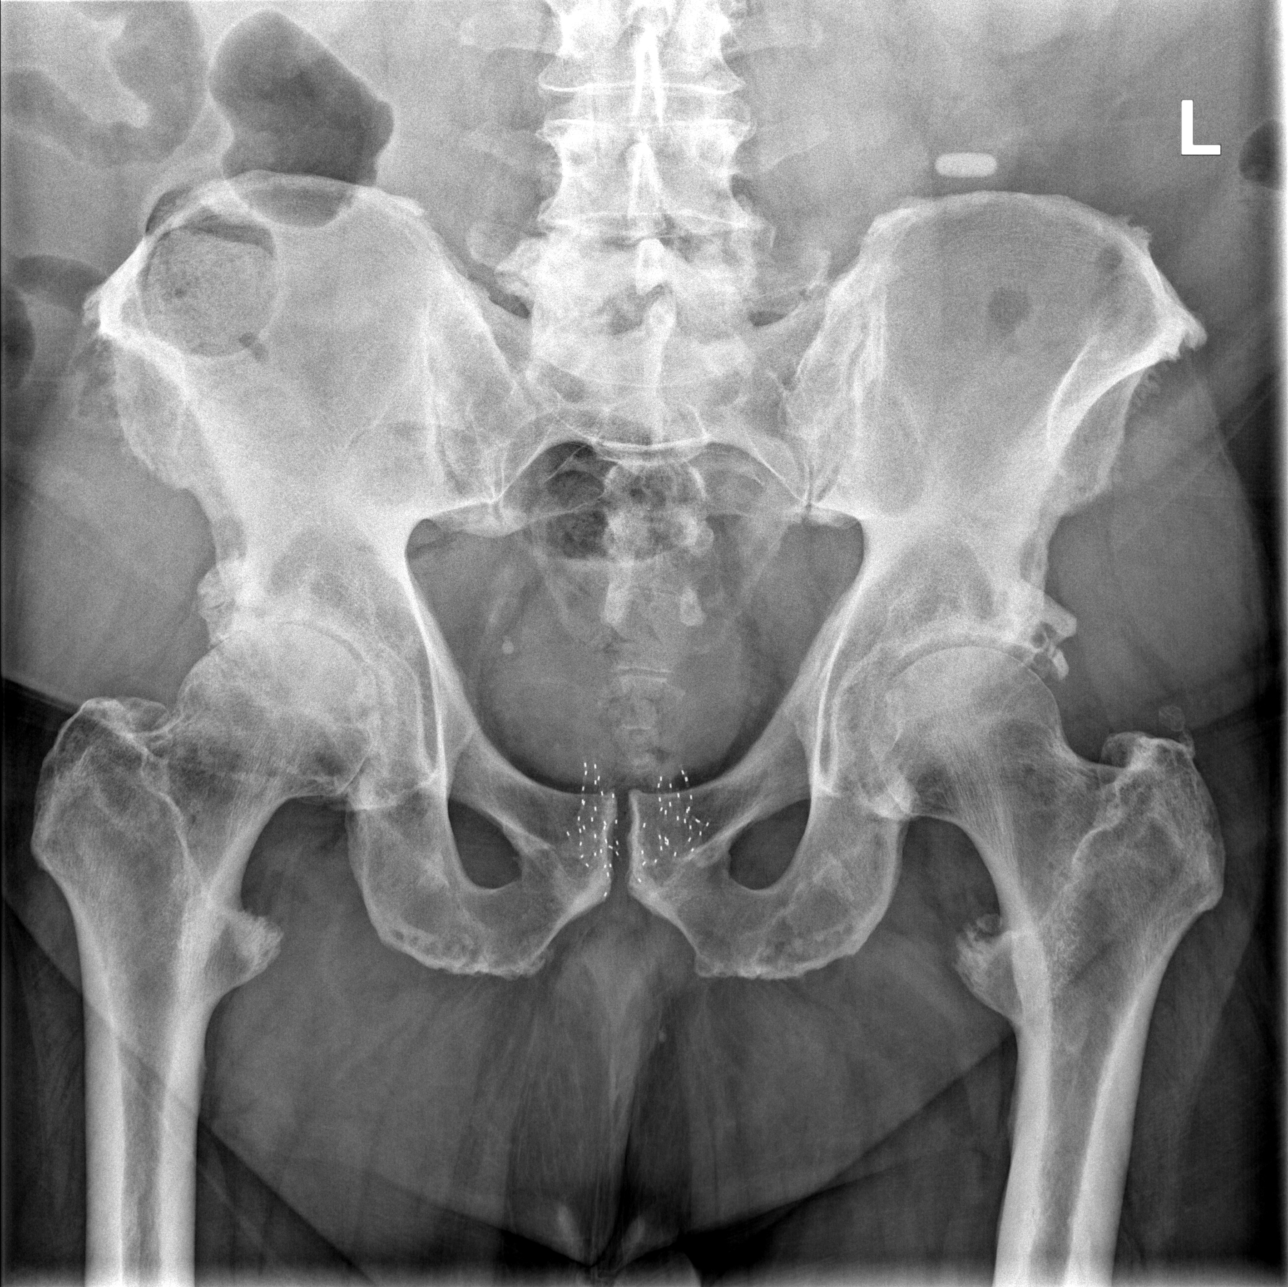

[t hip ap right]
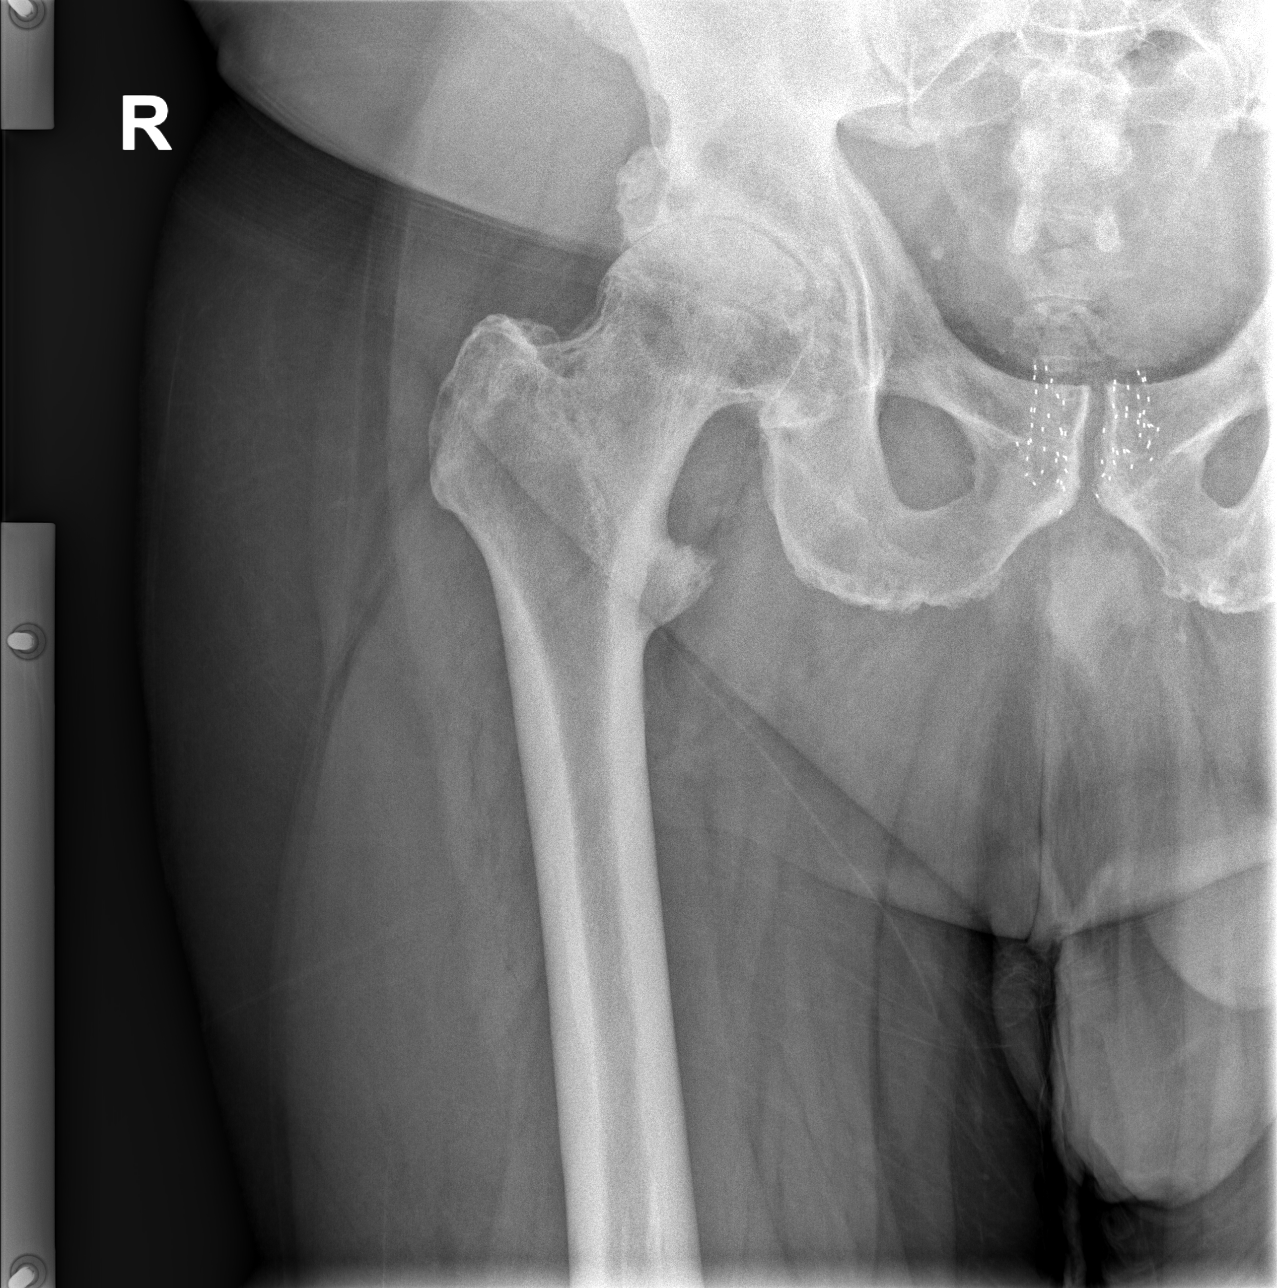

[t hip frog right]
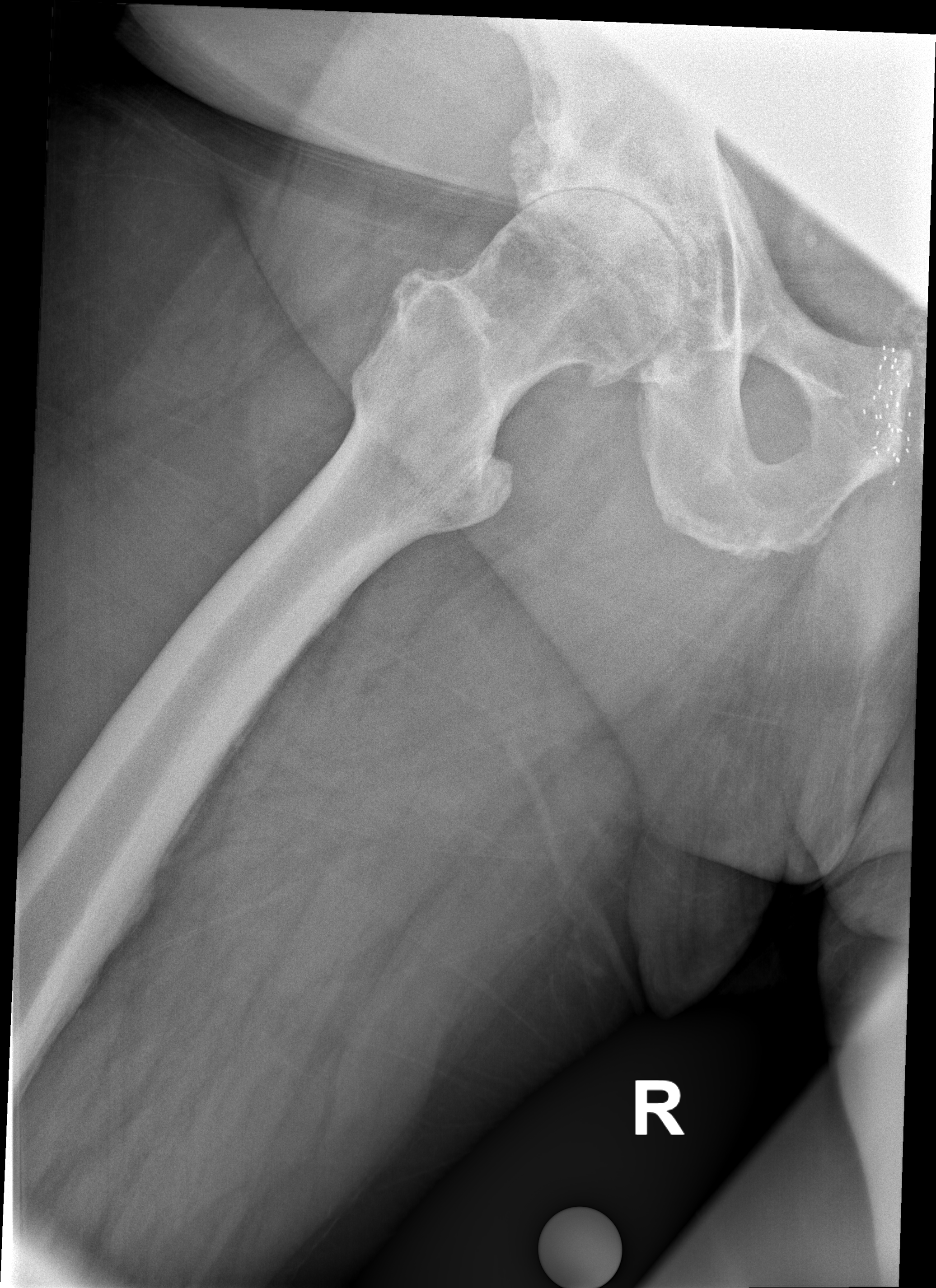

[3 of 3 positions shown; findings below may reference images not displayed]

EXAM

XR hip RT, 2-3V w or wo pelvis

INDICATION

pain
chronic pain. no known injury

TECHNIQUE

Three views of the right hip

COMPARISONS

None available at the time of dictation.

FINDINGS

Severe joint space loss and degenerative change of the right hip joint with bone-on-bone contact.
Opposing subchondral sclerosis and acetabular marginal osteophytosis. Brachytherapy beads within the
prostate.

IMPRESSION
1. Severe degenerative change of the right hip.

Tech Notes:

chronic pain. no known injury

## 2021-08-21 IMAGING — MR SPLUMBWO
9 of 12 series · 28 of 48 positions shown · non-contrast
Comparison: none

[Series 5: T2 · sagittal · 4.0mm · 0.68mm/px · 2 of 17 slices shown (1 of 4)]
[im 1/17]
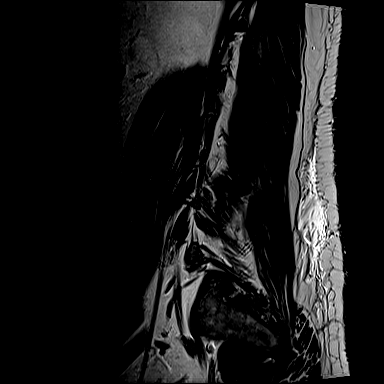
[im 17/17]
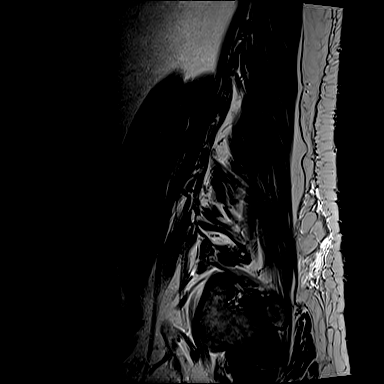

[Series 6: T1 · sagittal · 4.0mm · 0.81mm/px · 2 of 17 slices shown (1 of 4)]
[im 1/17]
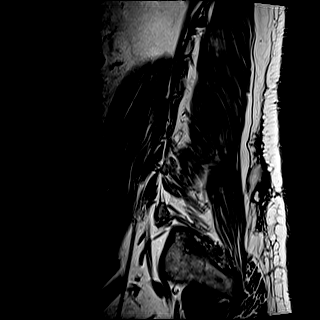
[im 17/17]
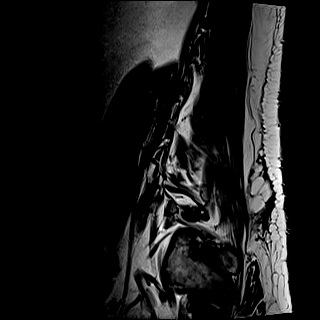

[Series 7: STIR · sagittal · 4.0mm · 0.51mm/px · 2 of 17 slices shown]
[im 1/17]
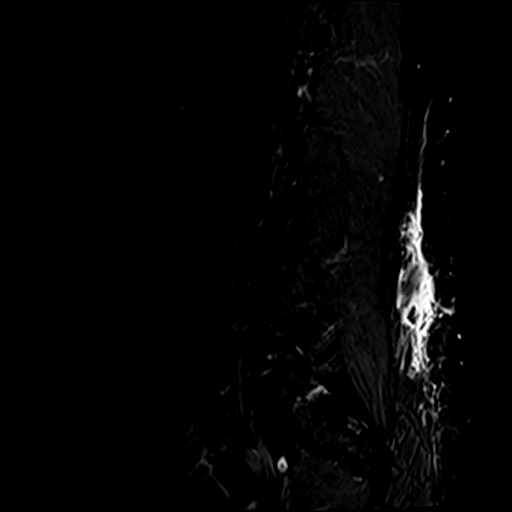
[im 17/17]
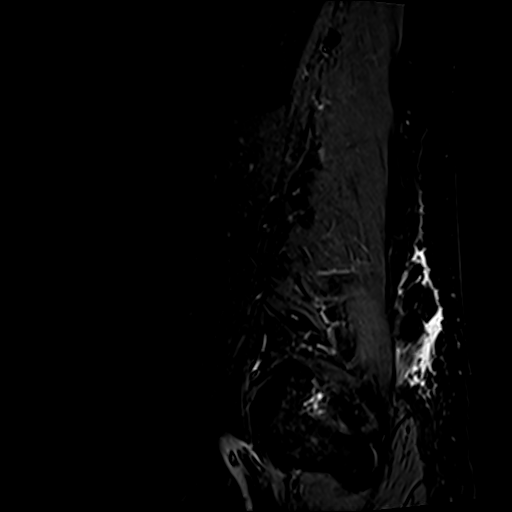

[Series 8: T2 · axial · 4.5mm · 0.81mm/px · z∈[+29,+162]mm · 4 of 26 slices shown (2 of 4)]
[im 1/26]
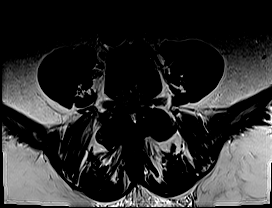
[im 9/26]
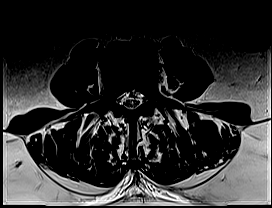
[im 17/26]
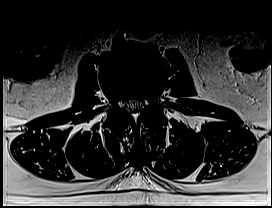
[im 26/26]
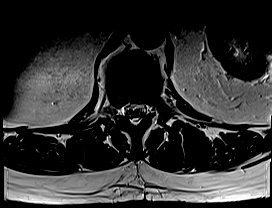

[Series 9: T2 · axial · 4.5mm · 0.81mm/px · z∈[-82,-15]mm · 2 of 15 slices shown (3 of 4)]
[im 1/15]
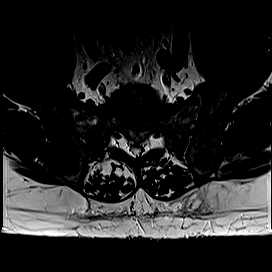
[im 15/15]
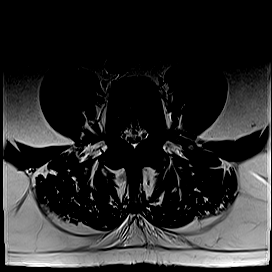

[Series 10: T2 · axial · 4.5mm · 0.81mm/px · z∈[-82,+162]mm · 5 of 37 slices shown (4 of 4)]
[im 1/37]
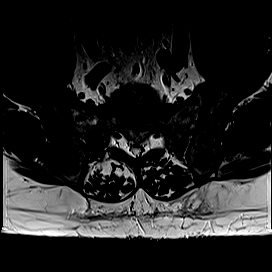
[im 10/37]
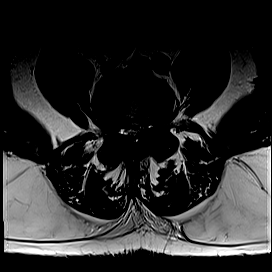
[im 19/37]
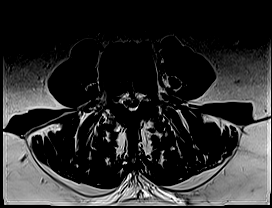
[im 28/37]
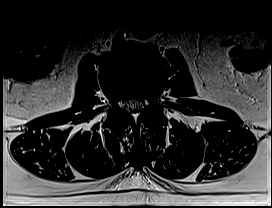
[im 37/37]
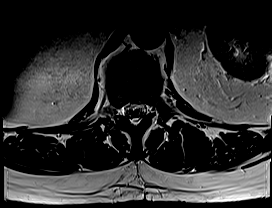

[Series 11: T1 · axial · 4.5mm · 0.43mm/px · z∈[+29,+162]mm · 4 of 26 slices shown (2 of 4)]
[im 1/26]
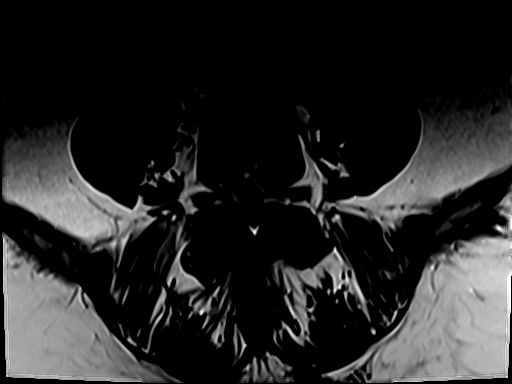
[im 9/26]
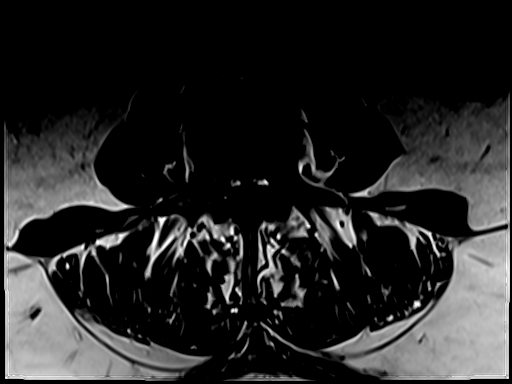
[im 17/26]
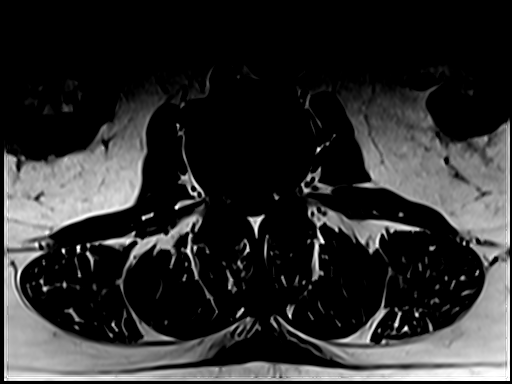
[im 26/26]
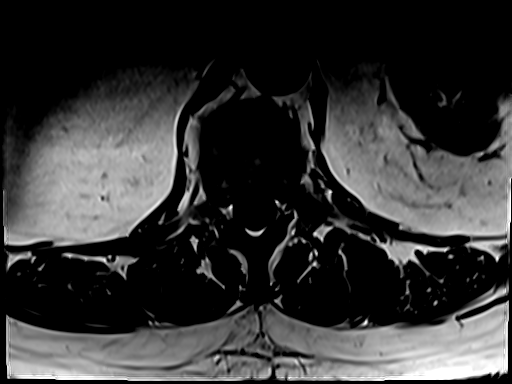

[Series 12: T1 · axial · 4.5mm · 0.43mm/px · z∈[-82,-15]mm · 2 of 15 slices shown (3 of 4)]
[im 1/15]
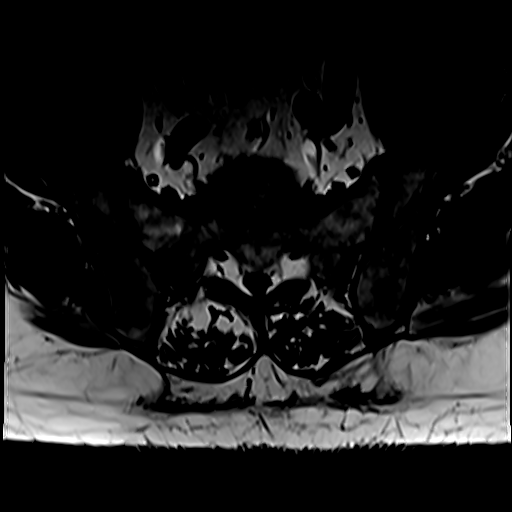
[im 15/15]
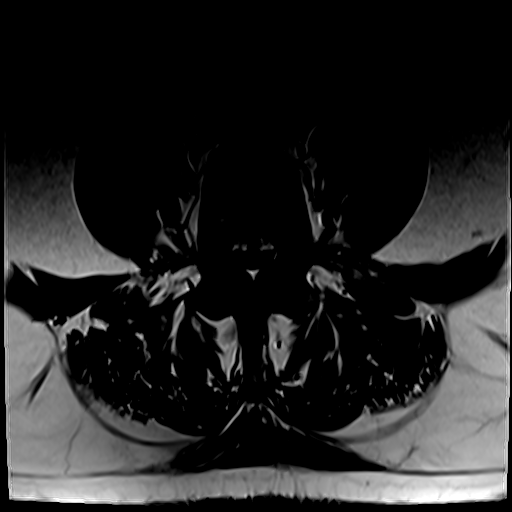

[Series 13: T1 · axial · 4.5mm · 0.43mm/px · z∈[-82,+162]mm · 5 of 37 slices shown (4 of 4)]
[im 1/37]
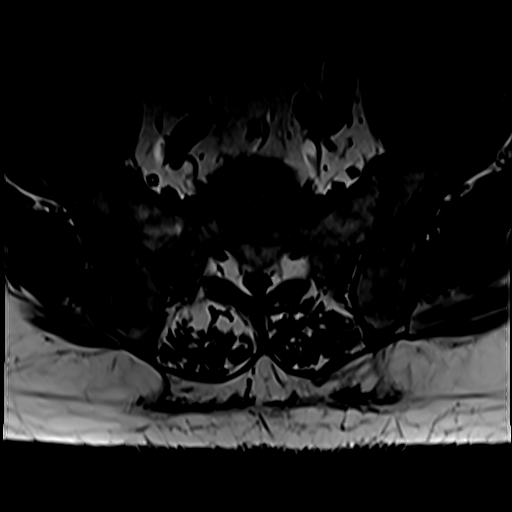
[im 10/37]
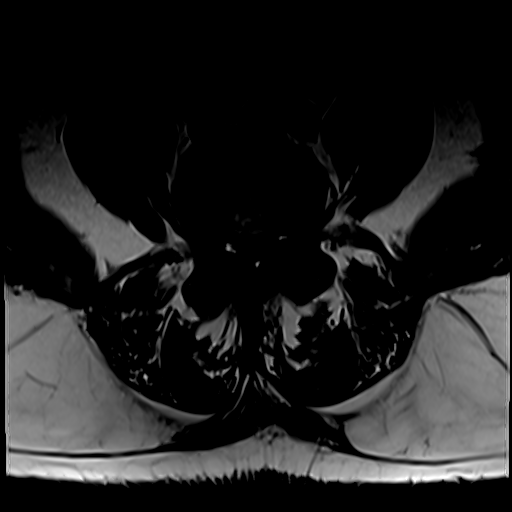
[im 19/37]
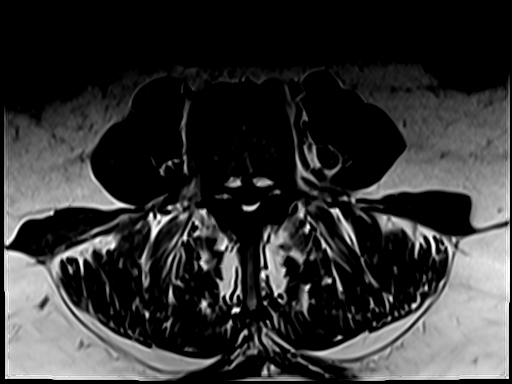
[im 28/37]
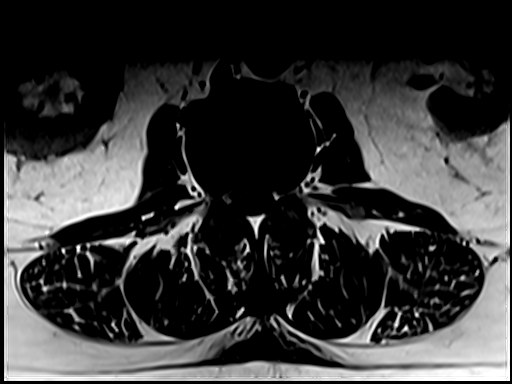
[im 37/37]
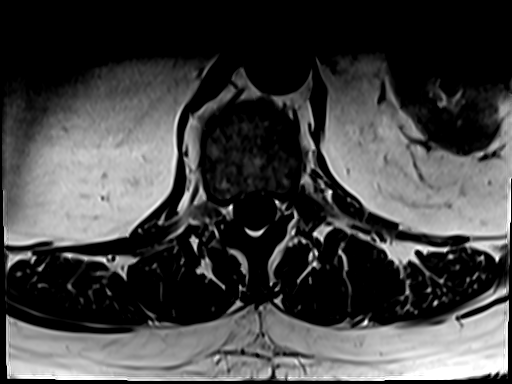

[28 of 48 positions shown; findings below may reference images not displayed]

DIAGNOSTIC STUDIES

EXAM

MRI lumbar spine without contrast.

INDICATION

pain
LOW BACK PAIN WITH RADIATION DOWN BILATERAL LEGS WITH NUMBNESS TO FEET.  RG

TECHNIQUE

Sagittal axial images were obtained with variable T1 and T2 weighting.

COMPARISONS

January 03, 2021

FINDINGS

Conus medullaris is normal in signal intensity and location. There is mild disc bulging at T12-L1
and L1-2 without central canal or neural foraminal stenosis.

L2-3: Loss of signal and disc bulging is seen with bilateral facet hypertrophy. Minimal central
canal narrowing is seen. Neural foramina are generally well preserved.

L3-4: Disc bulging and marked facet/ligament flavum hypertrophy is seen. This results in severe
central canal narrowing at L3-4. There is mild-to-moderate bilateral neural foraminal stenosis.

L4-5: Marked disc bulging and severe facet/ligament flavum hypertrophy is evident greater on the
right than left. There is severe central canal stenosis. There is mild-to-moderate left and moderate
to severe right neural foraminal stenosis.

L5-S1: Disc is generally well preserved. Facet hypertrophy is noted bilaterally without significant
central canal or neural foraminal stenosis.

IMPRESSION

Multilevel degenerative changes at the lumbar spine resulting in varying degrees of central canal
and neural foraminal stenosis. These are greatest at L3-4 and L4-5. Please see above discussion for
individual levels.

Tech Notes:

LOW BACK PAIN WITH RADIATION DOWN BILATERAL LEGS WITH NUMBNESS TO FEET.  RG

## 2021-09-03 IMAGING — CR [ID]
2 series · 2 of 2 positions shown · non-contrast
Comparison: none

[w chest pa]
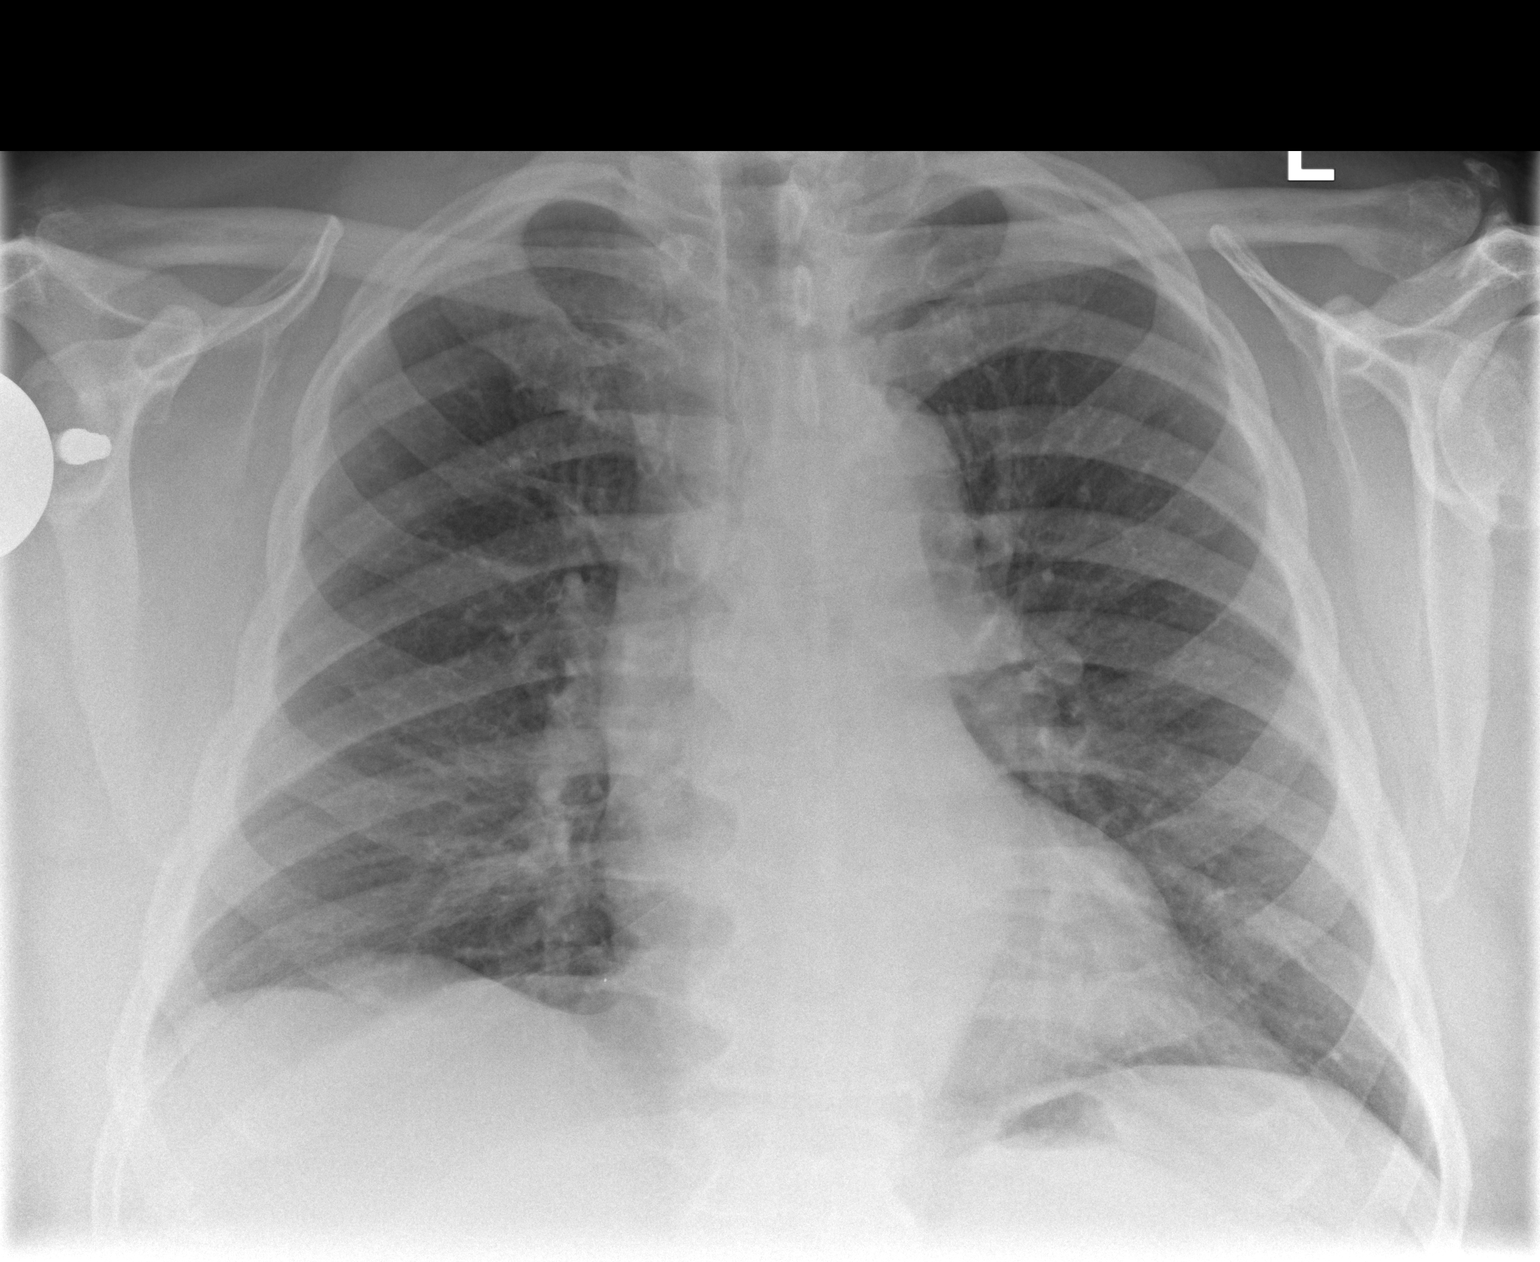

[w chest lat]
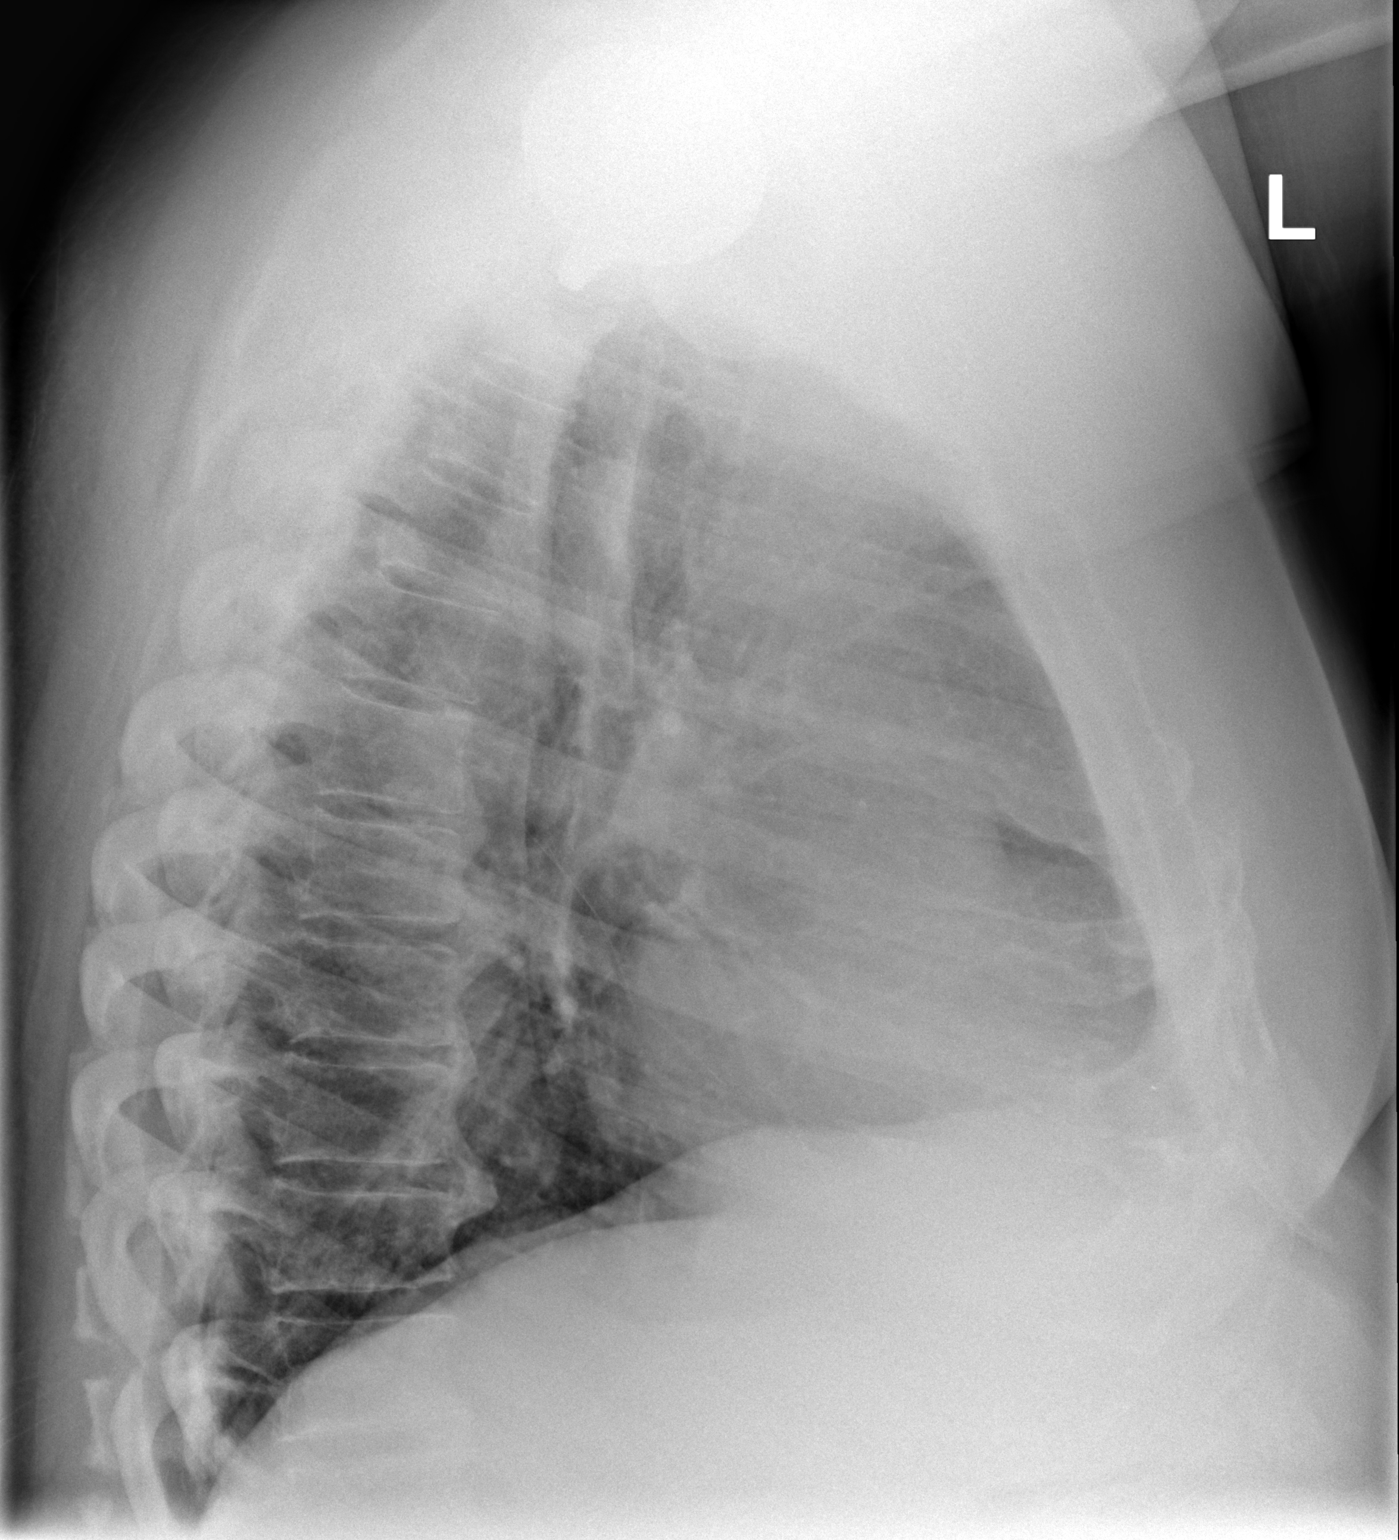

[2 of 2 positions shown; findings below may reference images not displayed]

DIAGNOSTIC STUDIES

EXAM

XR chest 2V

INDICATION

pre op hip replacement right
Pre op clearance for hip replacement. Pt states former smoker many years ago. Pt denies sx hx. CF

TECHNIQUE

PA and lateral views of the chest.

COMPARISONS

July 25, 2020

FINDINGS

Cardiac silhouette is unchanged. Lungs are clear. Right shoulder arthroplasty is noted. Osseous
structures are unchanged.

IMPRESSION

No acute infiltrates throughout the chest.

Tech Notes:

Pre op clearance for hip replacement. Pt states former smoker many years ago. Pt denies sx hx. CF

## 2022-07-07 ENCOUNTER — Encounter: Admit: 2022-07-07 | Discharge: 2022-07-07 | Payer: MEDICARE

## 2022-07-07 ENCOUNTER — Ambulatory Visit: Admit: 2022-07-07 | Discharge: 2022-07-07 | Payer: MEDICARE

## 2022-07-07 DIAGNOSIS — R609 Edema, unspecified: Secondary | ICD-10-CM

## 2022-07-09 ENCOUNTER — Encounter: Admit: 2022-07-09 | Discharge: 2022-07-09 | Payer: MEDICARE

## 2022-07-09 NOTE — Telephone Encounter
07/09/2022 Records request faxed to Dr. Herschell Dimes [phone] and [facility] per workqueue task below.  sdc    PCP - [facility] - Ozzie Hoyle MD - [address] [place] [address] Ph: [phone] Fax: [phone]  [facility] - calcium scan & echo done there - Fax: [phone] Ph: [phone]   [address] [place], [place] [address]

## 2022-07-30 ENCOUNTER — Encounter: Admit: 2022-07-30 | Discharge: 2022-07-30 | Payer: MEDICARE

## 2022-08-04 ENCOUNTER — Encounter: Admit: 2022-08-04 | Discharge: 2022-08-04 | Payer: MEDICARE

## 2022-08-04 DIAGNOSIS — Z136 Encounter for screening for cardiovascular disorders: Secondary | ICD-10-CM

## 2022-08-04 DIAGNOSIS — R931 Abnormal findings on diagnostic imaging of heart and coronary circulation: Secondary | ICD-10-CM

## 2022-08-04 DIAGNOSIS — R0609 Other forms of dyspnea: Secondary | ICD-10-CM

## 2022-08-04 DIAGNOSIS — E785 Hyperlipidemia, unspecified: Secondary | ICD-10-CM

## 2022-08-04 DIAGNOSIS — I10 Essential (primary) hypertension: Secondary | ICD-10-CM

## 2022-08-04 NOTE — Patient Instructions
Thank you for visiting our office today.    We would like to make the following medication adjustments:  NONE       Otherwise continue the same medications as you have been doing.          We will be pursuing the following tests after your appointment today:       Orders Placed This Encounter    NM PET/CT MYOCARDIAL IMAGING PERFUSION STRESS AND REST    NM PET ABSOLUTE QUANT MYOCARDIAL BLOOD FLOW    NM STRESS ECG    CTA CHEST WO/W CONT    LIPID PROFILE    ALT (SGPT)    ECG 12-LEAD     Call radiology to schedule testing at [phone]    We will plan to see you back in 6 months.  Please call us in the meantime with any questions or concerns.        Please allow 5-7 business days for our providers to review your results. All normal results will go to MyChart. If you do not have Mychart, it is strongly recommended to get this so you can easily view all your results. If you do not have mychart, we will attempt to call you once with normal lab and testing results. If we cannot reach you by phone with normal results, we will send you a letter.  If you have not heard the results of your testing after one week please give [place] a call.       Your Cardiovascular Medicine [place] Team Brett Canales, Pilar Jarvis, Shawna Orleans, and [place])  phone number is [phone].

## 2022-08-04 NOTE — Progress Notes
Date of Service: 08/04/2022    Alan Chen is a 72 y.o. male.       HPI   Alan Chen presents today for further evaluation and treatment of abnormalities noted on a coronary calcium score and echo Doppler study.  His coronary artery calcium scan showed coronary artery calcification as detailed below and his echo Doppler study showed enlargement of the aorta as detailed below.  Subjectively, he reports that he has been stable.  He does have chronic stable dyspnea with exertion.  His activities appear to be limited by arthritis in both knees and not by any cardiac symptoms.  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  Interestingly his wife was a captain in the prison service at the same time. He does report chronic mild orthostasis which equilibrates after he has been standing for 10 to 15 seconds.  This may correlate with his medications (furosemide and lisinopril).  He indicates that he was placed on furosemide in the past for lower extremity edema.  He has been compliant using his CPAP and indicates that usually he sleeps through the night.  Alan Chen reports no recent hospitalizations or emergency room visits. The patient indicates that he has been stable and reports no angina, congestive symptoms, palpitations, sensation of sustained forceful heart pounding, lightheadedness or syncope. Exercise tolerance has been stable. The patient reports no myalgias, bleeding abnormalities, claudication or strokelike symptoms.       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 LVEF 55%. Moderate concentric hypertrophy. Grade I LV diastolic dysfunction. Trace mitral and tricuspid regurgitation. Ascending aorta moderately dilated. Aortic root mildly dilated.   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   HENT: Negative.     Eyes: Negative.    Cardiovascular:  Positive for dyspnea on exertion and leg swelling.   Respiratory: Negative.     Endocrine: Negative.    Hematologic/Lymphatic: Negative.    Skin: Negative.    Musculoskeletal: Negative.    Gastrointestinal: Negative.    Genitourinary: Negative.    Neurological:  Positive for dizziness and light-headedness.   Psychiatric/Behavioral: Negative.     Allergic/Immunologic: Negative.        Physical Exam  GENERAL: The patient is well developed, well nourished, resting comfortably and in no distress.   HEENT: No abnormalities  of the visible oro-nasopharynx, conjunctiva or sclera are noted.  NECK: There is no jugular venous distension. Carotids are palpable and without bruits. There is no thyroid enlargement.  Chest: Lung fields are clear to auscultation. There are no wheezes or crackles.  CV: There is a regular rhythm. The first and second heart sounds are normal. There are no murmurs, gallops or rubs.  ABD: The abdomen is soft and supple with normal bowel sounds. There is no hepatosplenomegaly, ascites, tenderness, masses or bruits.  Neuro: There are no focal motor defects. Ambulation is normal. Cognitive function appears normal.  Ext: There is no 1-2+ bilateral lower extremity edema without evidence of deep vein thrombosis. Peripheral pulses are satisfactory.  He tends to dislocate his right shoulder with retraction.  SKIN: There are no rashes and no cellulitis  PSYCH: The patient is calm, rationale and oriented.    Cardiovascular Studies  A twelve-lead ECG obtained on 08/04/2022 reveals normal sinus bradycardia with a heart rate of 49 bpm.  Low voltage is noted along with nonspecific ST-T wave abnormalities.    A coronary calcium score dated 07/07/2022 revealed a total coronary calcium of 152 with all of the coronary calcium in the LAD.    Echo Doppler 07/07/2022:    The left ventricular systolic function is normal. The visually estimated ejection fraction is 55%    Moderate concentric hypertrophy.    Grade I (mild) left ventricular diastolic dysfunction. Normal left atrial pressure.    Trace mitral and tricuspid valve regurgitation.    The ascending aorta is moderately dilated. The aortic root is mildly dilated.    Estimated Peak Systolic PA Pressure22 mmHg     No previous study available for comparison.      Echocardiographic Findings    Left Ventricle Ventricle not well seen. The left ventricular size is normal. Wall thickness is increased. Moderate concentric hypertrophy. The left ventricular systolic function is normal. The visually estimated ejection fraction is 55%. Grade I (mild) left ventricular diastolic dysfunction. Normal left atrial pressure.   Right Ventricle Ventricle not well seen. The right ventricle is probably normal in size. The right ventricular systolic function is probably normal.   Left Atrium Normal size.   Right Atrium Mildly dilated.   IVC/SVC Elevated central venous pressure (5-10 mm Hg).   Mitral Valve Non-specific thickening. No stenosis. Trace regurgitation.   Tricuspid Valve The tricuspid valve was not well seen. No stenosis. Trace regurgitation.   Aortic Valve The aortic valve was not well seen. No stenosis. No regurgitation.   Pulmonary The pulmonic valve was not seen well but no Doppler evidence of stenosis.   Aorta The ascending aorta is moderately dilated. The aortic root is mildly dilated.   Pericardium No pericardial effusion.             Left Heart 2D Measurements (Normal Ranges)    EF (Visual) 55 %         LVIDD 5.2 cm  (Range: 4.2 - 5.8)         LVIDS 3.0 cm  (Range: 2.5 - 4)         IVS 1.4 cm  (Range: 0.6 - 1)         LV PW 1.4 cm  (Range: 0.6 - 1)         LA Size 4.5 cm  (Range: 3 - 4)             Right Heart 2D & M-Mode Measurements (Normal Ranges) (Range)  RV Basal Dia 4.1 cm  (2.5 - 4.1)         RV Mid Dia 3.2 cm  (1.9 - 3.5)         RAA 20.3 cm2  (<18)         M-Mode TAPSE 3.2 cm  (>1.7)            Left Heart 2D Addnl Measurements (Normal Ranges)    LV Systolic Vol 66 mL  (Range: 21 - 61)         LV Systolic Vol Index 25 mL/m2  (Range: 11 - 31)         LV Diastolic Vol 196 mL  (Range: 62 - 150)         LV Diastolic Vol Index 73 mL/m2  (Range: 34 - 74)         LA Vol 65 mL  (Range: 18 - 58)         LA Vol Index 24.34 mL/m2  (Range: 16 - 34)         LV Mass 310 g  (Range: 88 - 224)         LV Mass Index 116 g/m2  (Range: 49 - 115)         RWT 0.54  (Range: <=0.42)                Aortic Root Measurements (Normal Ranges)    Sinus 4.0 cm  (Range: 2.8 - 4)         AO ASC 4.0 cm           Cardiovascular Health Factors  Vitals BP Readings from Last 3 Encounters:   08/04/22 (!) 143/74   07/07/22 134/72     Wt Readings from Last 3 Encounters:   08/04/22 (!) 142.6 kg (314 lb 6.4 oz)   07/07/22 (!) 144 kg (317 lb 7.4 oz)     BMI Readings from Last 3 Encounters:   08/04/22 45.11 kg/m?   07/07/22 45.55 kg/m?      Smoking Social History     Tobacco Use   Smoking Status Former    Types: Cigarettes   Smokeless Tobacco Never      Lipid Profile Cholesterol   Date Value Ref Range Status   06/19/2021 224 (H)  Final     HDL   Date Value Ref Range Status   06/19/2021 39 (L)  Final LDL   Date Value Ref Range Status   06/19/2021 146 (H)  Final     Triglycerides   Date Value Ref Range Status   06/19/2021 196 (H)  Final      Blood Sugar No results found for: HGBA1C  Glucose   Date Value Ref Range Status   09/03/2021 120 (H)  Final          Problems Addressed Today  Encounter Diagnoses   Name Primary?    Screening for heart disease Yes    Hyperlipidemia, unspecified hyperlipidemia type     Hypertension, unspecified type     DOE (dyspnea on exertion)     Elevated coronary artery calcium score        Assessment and Plan   An echo Doppler study obtained on 07/07/2022 showed that his aortic root measured approximately 4.0 cm in diameter at the level of the sinus Valsalva and 4.0 cm in diameter at the level of the proximal ascending aorta.  Alternatives for further evaluation of an enlarged aorta were reviewed with the patient  and he wanted to proceed with a CTA of the thorax.  I have asked that this be scheduled.  I reviewed his coronary calcium score with him in detail.  Alternatives for further evaluation of an abnormal coronary calcium score were reviewed with the patient he wanted to proceed with a pharmacologic stress study.  He cannot obtain a exercise treadmill ECG because of arthritis in both knees.  I have ordered a PET/CT myocardial perfusion imaging study which can be performed more quickly considering his various arthritic abnormalities especially the right shoulder which tends to dislocate.  I have asked him to obtain a fasting lipid profile to see if he requires additional lipid-lowering medications.  Cardiovascular risk factor modification was discussed in great detail.  I have asked that the studies be obtained within the next 2 weeks.  I have asked him to return for follow-up in 6 months time to follow his progress. The total time spent during this interview and exam with preparation and chart review was 60 minutes.               Current Medications (including today's revisions)   diclofenac potassium (CATAFLAM) 50 mg tablet Take one tablet by mouth twice daily.    duloxetine DR (CYMBALTA) 30 mg capsule Take one capsule by mouth daily.    esomeprazole DR (NEXIUM) 40 mg capsule Take one capsule by mouth daily.    furosemide (LASIX) 40 mg tablet Take one tablet by mouth twice daily.    gabapentin (NEURONTIN) 300 mg capsule Take one capsule by mouth every 8 hours.    lisinopriL (ZESTRIL) 10 mg tablet Take one tablet by mouth daily.    potassium chloride (KLOR-CON SPRINKLE) 10 mEq capsule Take one capsule by mouth twice daily.    rosuvastatin (CRESTOR) 10 mg tablet Take one tablet by mouth daily.

## 2022-08-05 ENCOUNTER — Encounter: Admit: 2022-08-05 | Discharge: 2022-08-05 | Payer: MEDICARE

## 2022-08-06 ENCOUNTER — Encounter: Admit: 2022-08-06 | Discharge: 2022-08-06 | Payer: MEDICARE

## 2022-08-06 DIAGNOSIS — E785 Hyperlipidemia, unspecified: Secondary | ICD-10-CM

## 2022-08-06 DIAGNOSIS — R0609 Other forms of dyspnea: Secondary | ICD-10-CM

## 2022-08-06 DIAGNOSIS — I10 Essential (primary) hypertension: Secondary | ICD-10-CM

## 2022-08-06 DIAGNOSIS — R931 Abnormal findings on diagnostic imaging of heart and coronary circulation: Secondary | ICD-10-CM

## 2022-08-06 DIAGNOSIS — Z136 Encounter for screening for cardiovascular disorders: Secondary | ICD-10-CM

## 2022-09-01 ENCOUNTER — Encounter: Admit: 2022-09-01 | Discharge: 2022-09-01 | Payer: MEDICARE

## 2022-09-01 NOTE — Telephone Encounter
Called patient left a detailed message on voice mail with results and recommendations requested call back if questions or concerns.  Called Dr. Ledell Peoples office sent to voice mail left a detailed message with results and recommendations.  Requested Dr Herschell Dimes to follow up on pulmonary nodules in a year as indicated by radiology recommendations.  Faxed a copy of note to Dr. Ledell Peoples office.

## 2022-09-01 NOTE — Telephone Encounter
-----   Message from Wesley Blas, MD sent at 09/01/2022  3:15 PM CDT -----  Alan Chen: The CT thorax showed only mild aortic enlargement as we suspected from the echo.  Please inform the patient of the CT findings.  The radiologist commented that few scattered pulmonary nodules were noted which are likely benign.  In the absence of cancer smoking history no dedicated CT follow-up is required.  Otherwise follow-up CT chest in 12 months may be considered.   From the entries in his chart he may be a prior cigarette smoker.  Can you please send this report to his primary care physician Dr. Herschell Dimes and ask him to repeat the CT scan in 12 months if he feels that it is necessary.  Please document the conversation with Dr. Herschell Dimes and his commitment to follow-up on the recommendations concerning the small pulmonary nodules.  Please send me a flag after you have made the entry.  Thanks.  SBG

## 2022-09-03 ENCOUNTER — Encounter: Admit: 2022-09-03 | Discharge: 2022-09-03 | Payer: MEDICARE

## 2022-09-03 NOTE — Patient Instructions
Left voicemail with patient regarding the Regadenoson Stress PET/CT test scheduled for Monday 8/19 at 1100.    Instructed to check into the IR/Nuclear Medicine Department located on the second floor of [facility] @ 1000.     Instructed to have nothing to eat or drink after midnight on Sunday.  It is ok to take prescribed medications with a sip of water on the morning of the test.  Instructed to hold the following medications: Lasix.    Instructed to have  no caffeine products, including coffee, tea, soda, chocolate and decaf coffee 24 hours prior to test.    Instructed to wear comfortable clothing and avoid applying lotions and creams to chest.  Patient advised that a PIV will be started and multiple EKG stickers will be applied to chest.    Instructed to call if any questions.

## 2022-09-07 ENCOUNTER — Encounter: Admit: 2022-09-07 | Discharge: 2022-09-07 | Payer: MEDICARE

## 2022-09-07 DIAGNOSIS — E785 Hyperlipidemia, unspecified: Secondary | ICD-10-CM

## 2022-09-07 DIAGNOSIS — Z136 Encounter for screening for cardiovascular disorders: Principal | ICD-10-CM

## 2022-09-07 DIAGNOSIS — I10 Essential (primary) hypertension: Secondary | ICD-10-CM

## 2022-09-07 DIAGNOSIS — R0609 Other forms of dyspnea: Secondary | ICD-10-CM

## 2022-09-07 DIAGNOSIS — R931 Abnormal findings on diagnostic imaging of heart and coronary circulation: Secondary | ICD-10-CM

## 2022-09-07 MED ORDER — REGADENOSON 0.4 MG/5 ML IV SYRG
.4 mg | Freq: Once | INTRAVENOUS | 0 refills | Status: CP
Start: 2022-09-07 — End: ?
  Administered 2022-09-07: 16:00:00 0.4 mg via INTRAVENOUS

## 2022-09-07 MED ORDER — RP DX N-13 AMMONIA MCI
20 | Freq: Once | INTRAVENOUS | 0 refills | Status: CP
Start: 2022-09-07 — End: ?
  Administered 2022-09-07: 16:00:00 20.2 via INTRAVENOUS

## 2022-09-07 MED ORDER — AMINOPHYLLINE 25 MG/ML IV SOLN
50 mg | INTRAVENOUS | 0 refills | Status: AC | PRN
Start: 2022-09-07 — End: ?

## 2022-09-07 MED ORDER — EUCALYPTUS-MENTHOL MM LOZG
1 | Freq: Once | ORAL | 0 refills | Status: AC | PRN
Start: 2022-09-07 — End: ?

## 2022-09-07 MED ORDER — NITROGLYCERIN 0.4 MG SL SUBL
.4 mg | SUBLINGUAL | 0 refills | Status: AC | PRN
Start: 2022-09-07 — End: 2022-09-12

## 2022-09-07 MED ORDER — SODIUM CHLORIDE 0.9 % IV SOLP
250 mL | INTRAVENOUS | 0 refills | Status: AC | PRN
Start: 2022-09-07 — End: ?

## 2022-09-07 MED ORDER — ALBUTEROL SULFATE 90 MCG/ACTUATION IN HFAA
2 | RESPIRATORY_TRACT | 0 refills | Status: AC | PRN
Start: 2022-09-07 — End: 2022-09-12

## 2022-09-07 NOTE — Telephone Encounter
-----   Message from Wesley Blas, MD sent at 09/07/2022  3:58 PM CDT -----  Cletis [place]: Low risk stress study.  Please let him know.  Thanks.  SBG

## 2022-09-07 NOTE — Telephone Encounter
Results and recommendations called to patient. Patient has no questions or concerns at this time.

## 2022-09-08 ENCOUNTER — Ambulatory Visit: Admit: 2022-09-07 | Discharge: 2022-09-08 | Payer: MEDICARE

## 2023-02-11 ENCOUNTER — Encounter: Admit: 2023-02-11 | Discharge: 2023-02-11 | Payer: MEDICARE

## 2023-02-11 ENCOUNTER — Ambulatory Visit: Admit: 2023-02-11 | Discharge: 2023-02-11 | Payer: MEDICARE

## 2023-02-11 DIAGNOSIS — R0609 Other forms of dyspnea: Secondary | ICD-10-CM

## 2023-02-11 DIAGNOSIS — R931 Abnormal findings on diagnostic imaging of heart and coronary circulation: Secondary | ICD-10-CM

## 2023-02-11 DIAGNOSIS — R609 Edema, unspecified: Secondary | ICD-10-CM

## 2023-02-11 DIAGNOSIS — I10 Essential (primary) hypertension: Secondary | ICD-10-CM

## 2023-02-11 DIAGNOSIS — E785 Hyperlipidemia, unspecified: Secondary | ICD-10-CM

## 2023-02-11 MED ORDER — DILTIAZEM HCL 120 MG PO CP24
120 mg | ORAL_CAPSULE | Freq: Every day | ORAL | 1 refills | 90.00000 days | Status: AC
Start: 2023-02-11 — End: ?

## 2023-02-11 NOTE — Progress Notes
Date of Service: 02/11/2023    Tyrrell Stephens [place] is a 73 y.o. male.       HPI   Mr. Proehl initially seen on August 04, 2022 for further evaluation after obtaining an abnormal coronary calcium score.Marland Kitchen  His coronary artery calcium scan showed coronary artery calcification as detailed below and his echo Doppler study showed enlargement of the aorta as detailed below.  A CT scanning of the aorta was obtained along with a PET/CT myocardial perfusion imaging stress test.  Subjectively, Mr. Posthumus indicates that he has been stable over the past 6 months..  He does have chronic stable dyspnea with exertion.  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  Arthritis in the shoulders actually limits his activity more than arthritis in his knees.  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  Previously he has undergone right shoulder replacement but still reports significant arthritis in that shoulder.  Mr. 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  Interestingly his wife was a captain in the prison service at the same time. He does report chronic mild orthostasis which equilibrates after he has been standing for 10 to 15 seconds.  This may correlate with his medications (furosemide and lisinopril).  He indicates that he was placed on furosemide in the past for lower extremity edema.  He has been compliant using his CPAP and indicates that usually he sleeps through the night.  Mr. Lama reports no recent hospitalizations or emergency room visits. The patient indicates that he has been stable and reports no angina, congestive symptoms, palpitations, sensation of sustained forceful heart pounding, lightheadedness or syncope.  His exercise tolerance has been stable.  He actually goes to the local gym 4-5 times a week.  Mostly he uses the NuStep machine for 40 minutes because this does not involve shoulder movement.  He also uses the treadmill and Mr. Casebolt states that he would be amenable to water exercises.  The patient reports no myalgias, bleeding abnormalities, claudication or strokelike symptoms.            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 LVEF 55%. Moderate concentric hypertrophy. Grade I LV diastolic dysfunction. Trace mitral and tricuspid regurgitation. Ascending aorta moderately dilated. Aortic root mildly dilated.   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   HENT: Negative.     Eyes: Negative.    Cardiovascular: Negative.    Respiratory: Negative.     Endocrine: Negative.    Hematologic/Lymphatic: Negative.    Skin: Negative.    Musculoskeletal: Negative.    Gastrointestinal: Negative.    Genitourinary: Negative.    Neurological: Negative.    Psychiatric/Behavioral: Negative.     Allergic/Immunologic: Negative.        Physical Exam  GENERAL: The patient is well developed, well nourished, resting comfortably and in no distress.   HEENT: No abnormalities of the visible oro-nasopharynx, conjunctiva or sclera are noted.  NECK: There is no jugular venous distension. Carotids are palpable and without bruits. There is no thyroid enlargement.  Chest: Lung fields are clear to auscultation. There are no wheezes or crackles.  CV: There is a regular rhythm. The first and second heart sounds are normal. There are no murmurs, gallops or rubs.  ABD: The abdomen is soft and supple with normal bowel sounds. There is no hepatosplenomegaly, ascites, tenderness, masses or bruits.  Neuro: There are no focal motor defects. Ambulation is normal. Cognitive function appears normal.  Ext: There is no 1-2+ bilateral lower extremity edema without evidence of deep vein thrombosis. Peripheral pulses are satisfactory.  He tends to dislocate his right shoulder with retraction.  SKIN: There are no rashes and no cellulitis  PSYCH: The patient is calm, rationale and oriented.    Cardiovascular Studies  A twelve-lead ECG obtained on 08/04/2022 reveals normal sinus bradycardia with a heart rate of 49 bpm.  Low voltage is noted along with nonspecific ST-T wave abnormalities.     A coronary calcium score dated 07/07/2022 revealed a total coronary calcium of 152 with all of the coronary calcium in the LAD.     Echo Doppler 07/07/2022:    The left ventricular systolic function is normal. The visually estimated ejection fraction is 55%    Moderate concentric hypertrophy.    Grade I (mild) left ventricular diastolic dysfunction. Normal left atrial pressure.    Trace mitral and tricuspid valve regurgitation.    The ascending aorta is moderately dilated. The aortic root is mildly dilated.    Estimated Peak Systolic PA Pressure22 mmHg     No previous study available for comparison.      Echocardiographic Findings     Left Ventricle Ventricle not well seen. The left ventricular size is normal. Wall thickness is increased. Moderate concentric hypertrophy. The left ventricular systolic function is normal. The visually estimated ejection fraction is 55%. Grade I (mild) left ventricular diastolic dysfunction. Normal left atrial pressure.   Right Ventricle Ventricle not well seen. The right ventricle is probably normal in size. The right ventricular systolic function is probably normal.   Left Atrium Normal size.   Right Atrium Mildly dilated.   IVC/SVC Elevated central venous pressure (5-10 mm Hg).   Mitral Valve Non-specific thickening. No stenosis. Trace regurgitation.   Tricuspid Valve The tricuspid valve was not well seen. No stenosis. Trace regurgitation.   Aortic Valve The aortic valve was not well seen. No stenosis. No regurgitation.   Pulmonary The pulmonic valve was not seen well but no Doppler evidence of stenosis.   Aorta The ascending aorta is moderately dilated. The aortic root is mildly dilated.   Pericardium No pericardial effusion.                Left Heart 2D Measurements (Normal Ranges)  EF (Visual) 55 %         LVIDD 5.2 cm  (Range: 4.2 - 5.8)         LVIDS 3.0 cm  (Range: 2.5 - 4)         IVS 1.4 cm  (Range: 0.6 - 1)         LV PW 1.4 cm  (Range: 0.6 - 1)         LA Size 4.5 cm  (Range: 3 - 4)               Right Heart 2D & M-Mode Measurements (Normal Ranges) (Range)     RV Basal Dia 4.1 cm  (2.5 - 4.1)         RV Mid Dia 3.2 cm  (1.9 - 3.5)         RAA 20.3 cm2  (<18)         M-Mode TAPSE 3.2 cm  (>1.7)             Left Heart 2D Addnl Measurements (Normal Ranges)     LV Systolic Vol 66 mL  (Range: 21 - 61)         LV Systolic Vol Index 25 mL/m2  (Range: 11 - 31)         LV Diastolic Vol 196 mL  (Range: 62 - 150)         LV Diastolic Vol Index 73 mL/m2  (Range: 34 - 74)         LA Vol 65 mL  (Range: 18 - 58)         LA Vol Index 24.34 mL/m2  (Range: 16 - 34)         LV Mass 310 g  (Range: 88 - 224)         LV Mass Index 116 g/m2  (Range: 49 - 115)         RWT 0.54  (Range: <=0.42)                   Aortic Root Measurements (Normal Ranges)     Sinus 4.0 cm  (Range: 2.8 - 4)         AO ASC 4.0 cm             PET/CT myocardial perfusion imaging scan 09/07/2022:  IMPRESSION   1. Low risk study. Mild left ventricular dilatation with predominantly   reversible defect is noted about the mid septal wall for left ventricle   which on quantification, the area of reversibility is about 3-5% of the   left ventricular myocardium (approximately 7 mL left ventricular   myocardial volume).   2. No left ventricular wall motion abnormalities.   3. Normal rest and stress left ventricular ejection fraction at 60% and   69% respectively.   4. Normal rest and stress left ventricular myocardial blood flow in all 3   vascular territories with myocardial blood flow reserves all in excess of   2.   5. No significant coronary artery calcifications.   6. Small hiatal hernia.     CT chest 09/01/2022:  MPRESSION   1. Dilated ascending aorta measuring up to to 4.2 cm. No aortic   dissection or intramural hematoma.   2.  Few scattered small pulmonary nodules which are likely benign. In the   absence of cancer smoking history no dedicated CT follow-up is required   otherwise follow-up chest CT in 12 months may be considered.  Cardiovascular Health Factors  Vitals BP Readings from Last 3 Encounters:   02/11/23 (!) 170/91   08/04/22 (!) 143/74   07/07/22 134/72     Wt Readings from Last 3 Encounters:   02/11/23 (!) 147.1 kg (324 lb 6.4 oz)   08/04/22 (!) 142.6 kg (314 lb 6.4 oz)   07/07/22 (!) 144 kg (317 lb 7.4 oz)     BMI Readings from Last 3 Encounters:   02/11/23 46.55 kg/m?   09/01/22 45.11 kg/m?   08/04/22 45.11 kg/m?      Smoking Social History     Tobacco Use   Smoking Status Former    Types: Cigarettes   Smokeless Tobacco Never      Lipid Profile Cholesterol   Date Value Ref Range Status   08/05/2022 125  Final   08/05/2022 125  Final     HDL   Date Value Ref Range Status   08/05/2022 40  Final   08/05/2022 40  Final     LDL   Date Value Ref Range Status   08/05/2022 56  Final   08/05/2022 56  Final     Triglycerides   Date Value Ref Range Status   08/05/2022 146  Final 08/05/2022 146  Final      Blood Sugar No results found for: HGBA1C  Glucose   Date Value Ref Range Status   09/03/2021 120 (H)  Final          Problems Addressed Today  Coronary artery disease.  Abnormal stress test.  Hypertension.  Hypercholesterolemia.    Assessment and Plan   Alternatives for the treatment of hypertension reviewed with the patient and he wanted to start diltiazem 120 mg CD daily.  Possible adverse effects associated with diltiazem reviewed with the patient he was asked to report any worrisome symptoms.  Spironolactone or eplerenone could be options for this patient in the future. I have asked the patient to keep a log book of his BP readings and to report BP readings exceeding 130/80 mm Hg.  I have asked him to report his blood pressure readings within 1 month regardless.  Cardiovascular risk factor management was reviewed in detail.  Currently he indicates that he is taking a GLP-1 agonist but did not know which one he was taking.  He plans to start taking aspirin 81 mg daily because of his abnormal coronary calcium score and stress test.  Regular mild aerobic exercise, weight loss and adherence to a heart healthy diet were recommended.  He indicated that he was going to follow-up with his primary care physician to obtain a follow-up CT scan for further evaluation of the pulmonary nodules described above.  I have asked him to return for follow-up in 6 months time. The total time spent during this interview and exam with preparation and chart review was 30 minutes.         Current Medications (including today's revisions)   diclofenac potassium (CATAFLAM) 50 mg tablet Take one tablet by mouth twice daily.    dilTIAZem CD (CARDIZEM CD) 120 mg capsule Take one capsule by mouth daily.    duloxetine DR (CYMBALTA) 30 mg capsule Take one capsule by mouth daily.    esomeprazole DR (NEXIUM) 40 mg capsule Take one capsule by mouth daily.    furosemide (LASIX) 40 mg tablet Take one tablet by mouth twice daily.    gabapentin (NEURONTIN) 300 mg capsule Take one capsule by mouth daily.    lisinopriL (ZESTRIL) 10 mg  tablet Take one tablet by mouth daily.    potassium chloride (KLOR-CON SPRINKLE) 10 mEq capsule Take one capsule by mouth twice daily.    rosuvastatin (CRESTOR) 10 mg tablet Take one tablet by mouth daily.

## 2023-02-11 NOTE — Patient Instructions
Thank you for visiting our office today.    We would like to make the following medication adjustments:      Diltiazem 120mg  daily    **Monitor blood pressure and heart rates over the next month**       Otherwise continue the same medications as you have been doing.          We will be pursuing the following tests after your appointment today:       Orders Placed This Encounter    dilTIAZem CD (CARDIZEM CD) 120 mg capsule         We will plan to see you back in 6 months.  Please call us in the meantime with any questions or concerns.        Please allow 5-7 business days for our providers to review your results. All normal results will go to MyChart. If you do not have Mychart, it is strongly recommended to get this so you can easily view all your results. If you do not have mychart, we will attempt to call you once with normal lab and testing results. If we cannot reach you by phone with normal results, we will send you a letter.  If you have not heard the results of your testing after one week please give [place] a call.       Your Cardiovascular [facility] [place] Team Brett Canales, Pilar Jarvis, Shawna Orleans, and Andrews)  phone number is [phone].

## 2023-09-09 ENCOUNTER — Encounter: Admit: 2023-09-09 | Discharge: 2023-09-09 | Payer: MEDICARE

## 2023-12-03 ENCOUNTER — Encounter: Admit: 2023-12-03 | Discharge: 2023-12-03 | Payer: MEDICARE

## 2023-12-03 NOTE — Progress Notes [1]
 Phone call placed to PCP on file.  LVM with office staff regarding recommendations made on CTA Chest dated 09/01/2022. Requested phone call back to verify receipt of records and discuss recommendations.     CTA Chest dated 09/01/2022 sent to PCP on file. Report recommends a CT Chest.
